# Patient Record
Sex: Male | Born: 1980 | Race: White | Hispanic: No | Marital: Single | Smoking: Former smoker
Health system: Southern US, Community
[De-identification: ages and names within clinical notes are randomized; demographics above are authoritative.]

## PROBLEM LIST (undated history)

## (undated) DIAGNOSIS — Z22322 Carrier or suspected carrier of Methicillin resistant Staphylococcus aureus: Secondary | ICD-10-CM

## (undated) HISTORY — PX: NO PAST SURGERIES: SHX2092

---

## 2012-05-01 ENCOUNTER — Encounter (HOSPITAL_COMMUNITY): Payer: Self-pay | Admitting: *Deleted

## 2012-05-01 ENCOUNTER — Emergency Department (HOSPITAL_COMMUNITY)
Admission: EM | Admit: 2012-05-01 | Discharge: 2012-05-02 | Disposition: A | Discharge status: Home / Self Care | Payer: Self-pay | Attending: Emergency Medicine | Admitting: Emergency Medicine

## 2012-05-01 DIAGNOSIS — F17200 Nicotine dependence, unspecified, uncomplicated: Secondary | ICD-10-CM | POA: Insufficient documentation

## 2012-05-01 DIAGNOSIS — L03116 Cellulitis of left lower limb: Secondary | ICD-10-CM

## 2012-05-01 DIAGNOSIS — L02419 Cutaneous abscess of limb, unspecified: Secondary | ICD-10-CM | POA: Insufficient documentation

## 2012-05-01 DIAGNOSIS — M7989 Other specified soft tissue disorders: Secondary | ICD-10-CM | POA: Insufficient documentation

## 2012-05-01 DIAGNOSIS — R21 Rash and other nonspecific skin eruption: Secondary | ICD-10-CM | POA: Insufficient documentation

## 2012-05-01 NOTE — ED Notes (Signed)
Patient with left leg infection.  The left knee is red and swollen and does down to his ankle.

## 2012-05-02 MED ORDER — HYDROCODONE-ACETAMINOPHEN 5-500 MG PO TABS: 1.0000 | ORAL_TABLET | Freq: Four times a day (QID) | ORAL | Status: DC | PRN

## 2012-05-02 MED ORDER — VANCOMYCIN HCL IN DEXTROSE 1-5 GM/200ML-% IV SOLN
1000.0000 mg | Freq: Once | INTRAVENOUS | Status: AC
  Administered 2012-05-02: 1000 mg via INTRAVENOUS
  Filled 2012-05-02: qty 200

## 2012-05-02 MED ORDER — SULFAMETHOXAZOLE-TRIMETHOPRIM 800-160 MG PO TABS: 1.0000 | ORAL_TABLET | Freq: Two times a day (BID) | ORAL | Status: DC

## 2012-05-02 MED ORDER — SULFAMETHOXAZOLE-TMP DS 800-160 MG PO TABS
1.0000 | ORAL_TABLET | Freq: Once | ORAL | Status: AC
  Administered 2012-05-02: 1 via ORAL
  Filled 2012-05-02: qty 1

## 2012-05-02 NOTE — ED Notes (Signed)
Prescriptions x2 given with discharge instructions.  

## 2012-05-02 NOTE — ED Provider Notes (Signed)
History     CSN: [id number]  Arrival date & time 05/01/12  2132   First MD Initiated Contact with Patient 05/02/12 0002      Chief Complaint  Patient presents with  . Leg Swelling    (Consider location/radiation/quality/duration/timing/severity/associated sxs/prior treatment) HPI Comments: Patient with history of mrsa, now with redness and swelling to the left kneecap area.  No fevers or vomiting.  Patient is a 31 y.o. male presenting with rash. The history is provided by the patient.  Rash  This is a new problem. The current episode started 2 days ago. The problem has been gradually worsening. The problem is associated with nothing. There has been no fever. Affected Location: left knee. The pain is moderate. The pain has been constant since onset. Associated symptoms include pain. He has tried nothing for the symptoms. The treatment provided no relief.    History reviewed. No pertinent past medical history.  History reviewed. No pertinent past surgical history.  History reviewed. No pertinent family history.  History  Substance Use Topics  . Smoking status: Current Everyday Smoker    Types: Cigars  . Smokeless tobacco: Not on file  . Alcohol Use: Yes      Review of Systems  Skin: Positive for rash.  All other systems reviewed and are negative.    Allergies  Review of patient's allergies indicates no known allergies.  Home Medications   Current Outpatient Rx  Name Route Sig Dispense Refill  . ACETAMINOPHEN 500 MG PO TABS Oral Take 1,000 mg by mouth every 6 (six) hours as needed. For pain    . IBUPROFEN 200 MG PO TABS Oral Take 800 mg by mouth every 6 (six) hours as needed. For pain    . NAPROXEN 250 MG PO TABS Oral Take 250 mg by mouth 2 (two) times daily as needed. For pain      BP 108/56  Pulse 81  Temp 98.7 F (37.1 C) (Oral)  Resp 16  SpO2 97%  Physical Exam  Nursing note and vitals reviewed. Constitutional: He is oriented to person, place, and  time. He appears well-developed and well-nourished. No distress.  HENT:  Head: Normocephalic and atraumatic.  Neck: Normal range of motion. Neck supple.  Cardiovascular: Normal rate and regular rhythm.   Pulmonary/Chest: Effort normal.  Musculoskeletal: Normal range of motion.       There is an erythematous, swollen, ttp area to the lateral aspect of the left knee.  It is warm to the touch.  No pain with rom.  No streaking noted.  Good distal pulses.  Neurological: He is alert and oriented to person, place, and time.  Skin: Skin is warm. He is not diaphoretic.    ED Course  Procedures (including critical care time)  Labs Reviewed - No data to display No results found.   No diagnosis found.    MDM  Patient was given vancomycin and bactrim.  Discussed cdu, however he wants to go home with po meds and will return if her worsens.          Geoffery Lyons, MD 05/02/12 [phone]

## 2012-05-02 NOTE — ED Notes (Signed)
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

## 2012-05-04 ENCOUNTER — Observation Stay (HOSPITAL_COMMUNITY)
Admission: EM | Admit: 2012-05-04 | Discharge: 2012-05-05 | Disposition: A | Discharge status: Home / Self Care | Payer: Self-pay | Attending: Emergency Medicine | Admitting: Emergency Medicine

## 2012-05-04 DIAGNOSIS — L03119 Cellulitis of unspecified part of limb: Secondary | ICD-10-CM | POA: Insufficient documentation

## 2012-05-04 DIAGNOSIS — L02419 Cutaneous abscess of limb, unspecified: Principal | ICD-10-CM | POA: Insufficient documentation

## 2012-05-04 DIAGNOSIS — L02416 Cutaneous abscess of left lower limb: Secondary | ICD-10-CM

## 2012-05-04 DIAGNOSIS — L0390 Cellulitis, unspecified: Secondary | ICD-10-CM

## 2012-05-04 LAB — CBC
MCH: 31.7 pg (ref 26.0–34.0)
MCV: 87.8 fL (ref 78.0–100.0)
Platelets: 249 10*3/uL (ref 150–400)
RDW: 12.2 % (ref 11.5–15.5)
WBC: 12.8 10*3/uL — ABNORMAL HIGH (ref 4.0–10.5)

## 2012-05-04 LAB — BASIC METABOLIC PANEL
Calcium: 9.5 mg/dL (ref 8.4–10.5)
Creatinine, Ser: 0.87 mg/dL (ref 0.50–1.35)
GFR calc Af Amer: >90 mL/min (ref >90)

## 2012-05-04 MED ORDER — CLINDAMYCIN PHOSPHATE 900 MG/50ML IV SOLN
900.0000 mg | Freq: Once | INTRAVENOUS | Status: DC
  Filled 2012-05-04: qty 50

## 2012-05-04 MED ORDER — MORPHINE SULFATE 4 MG/ML IJ SOLN
4.0000 mg | INTRAMUSCULAR | Status: DC | PRN
  Administered 2012-05-05 (×2): 4 mg via INTRAVENOUS
  Filled 2012-05-04 (×2): qty 1

## 2012-05-04 MED ORDER — CLINDAMYCIN PHOSPHATE 600 MG/50ML IV SOLN
600.0000 mg | Freq: Once | INTRAVENOUS | Status: AC
  Administered 2012-05-04: 600 mg via INTRAVENOUS

## 2012-05-04 MED ORDER — CLINDAMYCIN PHOSPHATE 600 MG/50ML IV SOLN
600.0000 mg | Freq: Three times a day (TID) | INTRAVENOUS | Status: DC
  Administered 2012-05-05 (×2): 600 mg via INTRAVENOUS
  Filled 2012-05-04 (×3): qty 50

## 2012-05-04 MED ORDER — ZOLPIDEM TARTRATE 5 MG PO TABS
10.0000 mg | ORAL_TABLET | Freq: Every evening | ORAL | Status: DC | PRN
  Administered 2012-05-05: 10 mg via ORAL
  Filled 2012-05-04: qty 2

## 2012-05-04 MED ORDER — ONDANSETRON HCL 4 MG/2ML IJ SOLN: 4.0000 mg | Freq: Four times a day (QID) | INTRAMUSCULAR | Status: DC | PRN

## 2012-05-04 MED ORDER — ACETAMINOPHEN 325 MG PO TABS: 650.0000 mg | ORAL_TABLET | ORAL | Status: DC | PRN

## 2012-05-04 NOTE — ED Notes (Signed)
States his left knee is infected. Patient thinks he Eaker have staph infection. States he was here Monday for same. Describes pain as throbbing. Denies pain at this time, but states its starting to come back. Left knee wrapped in gauze. Bright red blood noted

## 2012-05-04 NOTE — ED Notes (Signed)
Lt. Knee pain. Last Friday, noticed a bump on lateral side of lt. Knee. Came up Monday for tx. Given iv antibiotics. And oral bactrim. And symptoms not getting better. The redness is spreading up thigh. And down to lt. Leg.

## 2012-05-04 NOTE — ED Provider Notes (Signed)
History    This chart was scribed for Rolan Bucco, MD, MD by Smitty Pluck. The patient was seen in room TR05C and the patient's care was started at 7:38PM.   CSN: [id number]  Arrival date & time 05/04/12  1807   First MD Initiated Contact with Patient 05/04/12 1857      Chief Complaint  Patient presents with  . Knee Pain    (Consider location/radiation/quality/duration/timing/severity/associated sxs/prior treatment) Patient is a 31 y.o. male presenting with knee pain. The history is provided by the patient.  Knee Pain Pertinent negatives include no headaches and no shortness of breath.   Miguel Kline is a 31 y.o. male who presents to the Emergency Department complaining of constant moderate left knee pain onset 5 days ago. Pt reports that he was in ED 3 days ago and was given IV abx and oral bactrim. He denies fevers. Reports symptoms have remained after abx treatment. He reports that redness is radiating throughout leg. Pain is aggravated by touch, walking and showering. He reports having green drainage. Denies pain in knee joint. Reports that his posterior knee feels tight.   No past medical history on file.  No past surgical history on file.  No family history on file.  History  Substance Use Topics  . Smoking status: Current Everyday Smoker    Types: Cigars  . Smokeless tobacco: Not on file  . Alcohol Use: Yes      Review of Systems  Constitutional: Negative for fever and chills.  Respiratory: Negative for cough and shortness of breath.   Gastrointestinal: Negative for nausea and vomiting.  Musculoskeletal: Positive for joint swelling.  Skin: Positive for wound. Negative for rash.  Neurological: Negative for headaches.  All other systems reviewed and are negative.    Allergies  Review of patient's allergies indicates no known allergies.  Home Medications   Current Outpatient Rx  Name Route Sig Dispense Refill  . HYDROCODONE-ACETAMINOPHEN 5-500 MG PO  TABS Oral Take 1-2 tablets by mouth every 6 (six) hours as needed for pain. 15 tablet 0  . NAPROXEN SODIUM 220 MG PO TABS Oral Take 220 mg by mouth daily as needed. For pain    . SULFAMETHOXAZOLE-TRIMETHOPRIM 800-160 MG PO TABS Oral Take 1 tablet by mouth every 12 (twelve) hours. 20 tablet 0    BP 133/61  Pulse 94  Temp 98.7 F (37.1 C) (Oral)  Resp 18  SpO2 95%  Physical Exam  Nursing note and vitals reviewed. Constitutional: He is oriented to person, place, and time. He appears well-developed and well-nourished.  HENT:  Head: Normocephalic and atraumatic.  Mouth/Throat: Oropharynx is clear and moist.  Eyes: Pupils are equal, round, and reactive to light.  Neck: Normal range of motion. Neck supple.       No pain to neck or back  Cardiovascular: Normal rate, regular rhythm, normal heart sounds and intact distal pulses.   Pulmonary/Chest: Effort normal and breath sounds normal.  Abdominal: Soft. Bowel sounds are normal. There is no tenderness.  Musculoskeletal: He exhibits edema and tenderness.       No pain in knee joint  No effusion No pain with ROM of left knee   Neurological: He is alert and oriented to person, place, and time.  Skin: Skin is warm and dry.       3 cm fluctuant area on lateral aspect of left knee  With large area of erythema     ED Course  INCISION AND DRAINAGE Date/Time: 05/04/2012 8:08 PM  Performed by: Rolan Bucco Authorized by: Rolan Bucco Consent: Verbal consent obtained. Risks and benefits: risks, benefits and alternatives were discussed Consent given by: patient Patient identity confirmed: verbally with patient Time out: Immediately prior to procedure a "time out" was called to verify the correct patient, procedure, equipment, support staff and site/side marked as required. Type: abscess Body area: lower extremity Location details: left leg Anesthesia: local infiltration Local anesthetic: lidocaine 1% without epinephrine Anesthetic  total: 4 ml Patient sedated: no Scalpel size: 11 Incision type: single straight Complexity: simple Drainage: purulent Drainage amount: copious Wound treatment: wound left open Packing material: 1/4 in gauze Patient tolerance: Patient tolerated the procedure well with no immediate complications.   (including critical care time) DIAGNOSTIC STUDIES: Oxygen Saturation is 95% on room air, normal by my interpretation.    COORDINATION OF CARE:     Labs Reviewed  WOUND CULTURE  CBC  BASIC METABOLIC PANEL  CULTURE, BLOOD (ROUTINE X 2)  CULTURE, BLOOD (ROUTINE X 2)   No results found.   1. Cellulitis       MDM  Pt with abscess to lateral right knee with large amount of surrounding cellulitis.  Abscess with opened here with large amount of purulent drainage.  Wound culture sent.  Will move to CDU for IV abx.  Exam is not suggestive of septic arthritis    I personally performed the services described in this documentation, which was scribed in my presence.  The recorded information has been reviewed and considered.      Rolan Bucco, MD 05/04/12 2010

## 2012-05-05 MED ORDER — OXYCODONE-ACETAMINOPHEN 5-325 MG PO TABS: 1.0000 | ORAL_TABLET | ORAL | Status: AC | PRN

## 2012-05-05 MED ORDER — CLINDAMYCIN HCL 150 MG PO CAPS: 300.0000 mg | ORAL_CAPSULE | Freq: Three times a day (TID) | ORAL | Status: DC

## 2012-05-05 MED ORDER — OXYCODONE-ACETAMINOPHEN 5-325 MG PO TABS
1.0000 | ORAL_TABLET | ORAL | Status: DC | PRN
  Administered 2012-05-05: 1 via ORAL
  Filled 2012-05-05: qty 1

## 2012-05-05 NOTE — ED Provider Notes (Signed)
I was available for the CDU portion of this patient's care.   Gerhard Munch, MD 05/05/12 1728

## 2012-05-05 NOTE — ED Notes (Signed)
Ortho coming to bring pt crutches

## 2012-05-05 NOTE — ED Notes (Signed)
Family at bedside. 

## 2012-05-05 NOTE — ED Provider Notes (Signed)
11:42 AM I have seen and examined pt in CDU. Pt on cellulitis protocol. Abscess to the left knee drained yesterday, surrounding cellulitis. Pt received clindamycin x2, plan to reassess after 3rd dose at 1300. Pt stats his cellulitis is improving.   Filed Vitals:   05/05/12 1115  BP: 123/81  Pulse: 81  Temp:   Resp:    Pt in NAD, lungs clear to auscultation bilat. Regular hr and rhythm. Left knee bandaged, some drainage noted through the dressing. Erythema extending up left lateral thigh, and down left calf, about mid way. Marked with surgical marker, does not extend past borders. Will continue monitoring  2:02 PM Pt received 3 doses of IV antibiotics now, i reassessed his knee wound, I was able to express a large amount of drainge out of the already packed wound. Cellulitis is improved, and has almost subsided from the calf, still some mild redness over the thigh. Pt afebrile, non toxic, wanting to go home. Will d/c with clindamycin, pain medications, return in 2 days.   Filed Vitals:   05/05/12 1200  BP: 125/68  Pulse: 84  Temp:   Resp:        Lottie Mussel, PA 05/05/12 1404

## 2012-05-05 NOTE — Progress Notes (Signed)
Orthopedic Tech Progress Note Patient Details:  Miguel Kline 1981-04-26 [id number]  Ortho Devices Type of Ortho Device: Crutches Ortho Device/Splint Interventions: Casandra Doffing 05/05/2012, 2:18 PM

## 2012-05-07 ENCOUNTER — Emergency Department (HOSPITAL_COMMUNITY)
Admission: EM | Admit: 2012-05-07 | Discharge: 2012-05-08 | Disposition: A | Discharge status: Home / Self Care | Payer: Self-pay | Attending: Emergency Medicine | Admitting: Emergency Medicine

## 2012-05-07 DIAGNOSIS — Z4801 Encounter for change or removal of surgical wound dressing: Secondary | ICD-10-CM | POA: Insufficient documentation

## 2012-05-08 ENCOUNTER — Encounter (HOSPITAL_COMMUNITY): Payer: Self-pay | Admitting: Emergency Medicine

## 2012-05-08 ENCOUNTER — Emergency Department (HOSPITAL_COMMUNITY)
Admission: EM | Admit: 2012-05-08 | Discharge: 2012-05-08 | Disposition: A | Discharge status: Home / Self Care | Payer: Self-pay | Attending: Emergency Medicine | Admitting: Emergency Medicine

## 2012-05-08 DIAGNOSIS — F17200 Nicotine dependence, unspecified, uncomplicated: Secondary | ICD-10-CM | POA: Insufficient documentation

## 2012-05-08 DIAGNOSIS — L0291 Cutaneous abscess, unspecified: Secondary | ICD-10-CM

## 2012-05-08 DIAGNOSIS — L02419 Cutaneous abscess of limb, unspecified: Secondary | ICD-10-CM | POA: Insufficient documentation

## 2012-05-08 LAB — WOUND CULTURE: Gram Stain: NONE SEEN

## 2012-05-08 MED ORDER — HYDROCODONE-ACETAMINOPHEN 5-325 MG PO TABS: 1.0000 | ORAL_TABLET | Freq: Four times a day (QID) | ORAL | Status: AC | PRN

## 2012-05-08 MED ORDER — LIDOCAINE HCL 2 % IJ SOLN
5.0000 mL | Freq: Once | INTRAMUSCULAR | Status: AC
  Administered 2012-05-08: 100 mg via INTRADERMAL

## 2012-05-08 NOTE — ED Notes (Signed)
Pt. Stated, I'm here to get "antibiotic strips out of my knee"

## 2012-05-08 NOTE — ED Notes (Signed)
Returned to have packing removed from wound to left lateral knee. Noted scant amt serous drainage at site. No redness or edema to surrounding areas

## 2012-05-08 NOTE — ED Provider Notes (Signed)
Medical screening examination/treatment/procedure(s) were performed by non-physician practitioner and as supervising physician I was immediately available for consultation/collaboration.   Gwyneth Sprout, MD 05/08/12 1513

## 2012-05-08 NOTE — ED Provider Notes (Signed)
History     CSN: [id number]  Arrival date & time 05/08/12  7829   First MD Initiated Contact with Patient 05/08/12 0915      Chief Complaint  Patient presents with  . Wound Check    (Consider location/radiation/quality/duration/timing/severity/associated sxs/prior treatment) HPI Comments: Patient with hx abscess I&D in ED 2 days ago.  Pt is on bactrim and keflex, reports great improvement in redness, swelling, and pain.  Denies fevers.  States he attempted to take the packing out on his own but it is too painful.  Requests localized numbing medication prior to attempting this here.    Patient is a 31 y.o. male presenting with wound check. The history is provided by the patient.  Wound Check     History reviewed. No pertinent past medical history.  History reviewed. No pertinent past surgical history.  No family history on file.  History  Substance Use Topics  . Smoking status: Current Everyday Smoker    Types: Cigars  . Smokeless tobacco: Not on file  . Alcohol Use: Yes      Review of Systems  Constitutional: Negative for fever and chills.  Skin: Positive for wound. Negative for color change, pallor and rash.    Allergies  Review of patient's allergies indicates no known allergies.  Home Medications   Current Outpatient Rx  Name Route Sig Dispense Refill  . CLINDAMYCIN HCL 150 MG PO CAPS Oral Take 300 mg by mouth 3 (three) times daily. Starting 05/05/12 for 10 days    . NAPROXEN SODIUM 220 MG PO TABS Oral Take 220 mg by mouth daily as needed. For pain    . OXYCODONE-ACETAMINOPHEN 5-325 MG PO TABS Oral Take 1 tablet by mouth every 4 (four) hours as needed for pain. 20 tablet 0  . SULFAMETHOXAZOLE-TRIMETHOPRIM 800-160 MG PO TABS Oral Take 1 tablet by mouth every 12 (twelve) hours. Starting 05/02/12 for 10 days      BP 114/4  Pulse 99  Temp 98.4 F (36.9 C) (Oral)  Resp 16  SpO2 100%  Physical Exam  Nursing note and vitals reviewed. Constitutional: He  appears well-developed and well-nourished. No distress.  HENT:  Head: Normocephalic and atraumatic.  Neck: Neck supple.  Pulmonary/Chest: Effort normal.  Neurological: He is alert.  Skin: He is not diaphoretic.       Skin just superior to left lateral knee with packing in place.  No active discharge.  Pt with surgical marker marking previous cellulitis involving much of left thigh and left ankle.  Skin within this area is now clear.  No erythema, edema, warmth.  There is one small (approx 2 cm diameter) erythematous spot superior to the abscess/packing without induration or fluctuance.      ED Course  Procedures (including critical care time)  Labs Reviewed - No data to display No results found.  Abscess rechecked.  Localized numbing agent used prior to packing removal per patient request.  This was performed by Laverle Patter, PA-S, under my supervision.  Area around abscess injected locally with 2% lidocaine without epinephrine, packing removed.  Wound is clean with serous drainage.  Dry dressing applied by nurse.    1. Abscess       MDM  Nontoxic afebrile patient presents for recheck after I&D 4 days ago.  Has been taking bactrim and keflex with great improvement.  Packing removed.  No need to repack wound.  Healing well.  Pt requests more pain medication as it continues to be sore.  Have  d/c home with #8 norco.  Return precautions given.  Patient verbalizes understanding and agrees with plan.          Rise Patience, [place] 05/08/12 1118

## 2012-05-08 NOTE — ED Notes (Signed)
See downtime charting. 

## 2012-05-09 ENCOUNTER — Telehealth (HOSPITAL_COMMUNITY): Payer: Self-pay | Admitting: Emergency Medicine

## 2012-05-09 NOTE — ED Notes (Signed)
+  wound Chart sent to EDP office for review. 

## 2012-05-09 NOTE — ED Notes (Signed)
Patient informed of positive results after id'd x 2 and educated to MRSA precautions. 

## 2012-05-11 LAB — CULTURE, BLOOD (ROUTINE X 2): Culture: NO GROWTH

## 2012-10-10 ENCOUNTER — Emergency Department (HOSPITAL_COMMUNITY)
Admission: EM | Admit: 2012-10-10 | Discharge: 2012-10-10 | Disposition: A | Discharge status: Home / Self Care | Payer: Self-pay | Attending: Emergency Medicine | Admitting: Emergency Medicine

## 2012-10-10 ENCOUNTER — Encounter (HOSPITAL_COMMUNITY): Payer: Self-pay | Admitting: *Deleted

## 2012-10-10 DIAGNOSIS — L0390 Cellulitis, unspecified: Secondary | ICD-10-CM

## 2012-10-10 DIAGNOSIS — F17200 Nicotine dependence, unspecified, uncomplicated: Secondary | ICD-10-CM | POA: Insufficient documentation

## 2012-10-10 DIAGNOSIS — R21 Rash and other nonspecific skin eruption: Secondary | ICD-10-CM | POA: Insufficient documentation

## 2012-10-10 DIAGNOSIS — M25579 Pain in unspecified ankle and joints of unspecified foot: Secondary | ICD-10-CM | POA: Insufficient documentation

## 2012-10-10 DIAGNOSIS — L02419 Cutaneous abscess of limb, unspecified: Secondary | ICD-10-CM | POA: Insufficient documentation

## 2012-10-10 MED ORDER — DOXYCYCLINE HYCLATE 100 MG PO TABS
100.0000 mg | ORAL_TABLET | Freq: Once | ORAL | Status: AC
  Administered 2012-10-10: 100 mg via ORAL
  Filled 2012-10-10: qty 1

## 2012-10-10 MED ORDER — SULFAMETHOXAZOLE-TRIMETHOPRIM 800-160 MG PO TABS: 1.0000 | ORAL_TABLET | Freq: Two times a day (BID) | ORAL | Status: AC

## 2012-10-10 MED ORDER — HYDROCODONE-ACETAMINOPHEN 5-325 MG PO TABS: 2.0000 | ORAL_TABLET | ORAL | Status: DC | PRN

## 2012-10-10 MED ORDER — HYDROCODONE-ACETAMINOPHEN 5-325 MG PO TABS
1.0000 | ORAL_TABLET | Freq: Once | ORAL | Status: AC
  Administered 2012-10-10: 1 via ORAL
  Filled 2012-10-10: qty 1

## 2012-10-10 MED ORDER — DOXYCYCLINE HYCLATE 100 MG PO CAPS: 100.0000 mg | ORAL_CAPSULE | Freq: Two times a day (BID) | ORAL | Status: DC

## 2012-10-10 MED ORDER — SULFAMETHOXAZOLE-TMP DS 800-160 MG PO TABS
1.0000 | ORAL_TABLET | Freq: Once | ORAL | Status: AC
  Administered 2012-10-10: 1 via ORAL
  Filled 2012-10-10: qty 1

## 2012-10-10 NOTE — ED Notes (Signed)
The pt has a boil on his lt thigh.  He has been squeezing the area.  It started 3 -4 days ago.  He is also c/o lt foot pain

## 2012-10-10 NOTE — ED Provider Notes (Signed)
History     CSN: [id number]  Arrival date & time 10/10/12  0102   First MD Initiated Contact with Patient 10/10/12 0117      Chief Complaint  Patient presents with  . Recurrent Skin Infections    (Consider location/radiation/quality/duration/timing/severity/associated sxs/prior treatment) Patient is a 31 y.o. male presenting with rash. The history is provided by the patient.  Rash  The current episode started more than 2 days ago. The problem has been gradually worsening. The problem is associated with nothing. There has been no fever. The rash is present on the left upper leg. The pain is at a severity of 4/10. The pain is mild. The pain has been constant since onset. Associated symptoms include itching. He has tried nothing for the symptoms. The treatment provided no relief.    History reviewed. No pertinent past medical history.  History reviewed. No pertinent past surgical history.  No family history on file.  History  Substance Use Topics  . Smoking status: Current Every Day Smoker    Types: Cigars  . Smokeless tobacco: Not on file  . Alcohol Use: Yes      Review of Systems  Skin: Positive for itching and rash.  All other systems reviewed and are negative.    Allergies  Review of patient's allergies indicates no known allergies.  Home Medications   Current Outpatient Rx  Name  Route  Sig  Dispense  Refill  . CLINDAMYCIN HCL 150 MG PO CAPS   Oral   Take 300 mg by mouth 3 (three) times daily. Starting 05/05/12 for 10 days         . NAPROXEN SODIUM 220 MG PO TABS   Oral   Take 220 mg by mouth daily as needed. For pain         . SULFAMETHOXAZOLE-TRIMETHOPRIM 800-160 MG PO TABS   Oral   Take 1 tablet by mouth every 12 (twelve) hours. Starting 05/02/12 for 10 days           BP 129/73  Pulse 86  Resp 18  SpO2 100%  Physical Exam  Constitutional: He is oriented to person, place, and time. He appears well-developed and well-nourished.  HENT:   Head: Normocephalic and atraumatic.  Eyes: Conjunctivae normal are normal. Pupils are equal, round, and reactive to light.  Neck: Normal range of motion. Neck supple.  Cardiovascular: Normal rate, regular rhythm, normal heart sounds and intact distal pulses.   Pulmonary/Chest: Effort normal and breath sounds normal.  Abdominal: Soft. Bowel sounds are normal.  Neurological: He is alert and oriented to person, place, and time.  Skin: Skin is warm and dry.       + mild cellulitis size of silver dollar to dorsum of left mid thigh.  nonfluctuant  Psychiatric: He has a normal mood and affect. His behavior is normal. Judgment and thought content normal.    ED Course  Procedures (including critical care time)  Labs Reviewed - No data to display No results found.   No diagnosis found.    MDM  Minimal cellulitis.  Will trat, d c to oupt fu,  Ret new/worsening sxs        Yiselle Babich Lytle Michaels, MD 10/10/12 0131

## 2013-04-11 ENCOUNTER — Emergency Department (HOSPITAL_COMMUNITY)
Admission: EM | Admit: 2013-04-11 | Discharge: 2013-04-12 | Disposition: A | Discharge status: Home / Self Care | Payer: Self-pay | Attending: Emergency Medicine | Admitting: Emergency Medicine

## 2013-04-11 ENCOUNTER — Encounter (HOSPITAL_COMMUNITY): Payer: Self-pay | Admitting: Emergency Medicine

## 2013-04-11 DIAGNOSIS — L02415 Cutaneous abscess of right lower limb: Secondary | ICD-10-CM

## 2013-04-11 DIAGNOSIS — F17200 Nicotine dependence, unspecified, uncomplicated: Secondary | ICD-10-CM | POA: Insufficient documentation

## 2013-04-11 DIAGNOSIS — IMO0001 Reserved for inherently not codable concepts without codable children: Secondary | ICD-10-CM | POA: Insufficient documentation

## 2013-04-11 DIAGNOSIS — L02419 Cutaneous abscess of limb, unspecified: Secondary | ICD-10-CM | POA: Insufficient documentation

## 2013-04-11 DIAGNOSIS — L03119 Cellulitis of unspecified part of limb: Secondary | ICD-10-CM | POA: Insufficient documentation

## 2013-04-11 NOTE — ED Notes (Signed)
PT. REPORTS ABSCESS AT RIGHT KNEE WITH DRAINAGE ONSET 2 DAYS AGO.

## 2013-04-12 MED ORDER — SULFAMETHOXAZOLE-TRIMETHOPRIM 800-160 MG PO TABS: 1.0000 | ORAL_TABLET | Freq: Two times a day (BID) | ORAL | Status: AC

## 2013-04-12 MED ORDER — CEPHALEXIN 500 MG PO CAPS: 500.0000 mg | ORAL_CAPSULE | Freq: Four times a day (QID) | ORAL | Status: DC

## 2013-04-12 NOTE — ED Provider Notes (Signed)
History    CSN: [id number] Arrival date & time 04/11/13  2338  First MD Initiated Contact with Patient 04/11/13 2358     Chief Complaint  Patient presents with  . Abscess   (Consider location/radiation/quality/duration/timing/severity/associated sxs/prior Treatment) HPI Comments: Abscess of R medial knee x 3 days. Patient has been applying warm compresses with mild relief and expelled blood, clear fluid, and small amount of purulent drainage PTA. Denies numbness/tingling, extremity weakness, linear red streaking, fevers, and inability to ambulate.  Patient is a 32 y.o. male presenting with abscess. The history is provided by the patient. No language interpreter was used.  Abscess Abscess location: medial R knee. Size:  1 cm area of induration with area of surrounding erythema of approximately 3.5 cm Abscess quality: induration, painful, redness and warmth   Abscess quality: not draining and no fluctuance   Red streaking: no   Duration:  3 days Progression:  Worsening Pain details:    Quality:  Throbbing   Severity:  Mild   Duration:  2 days   Timing:  Intermittent   Subjective pain progression: mildly worsening. Chronicity:  Recurrent Relieved by:  Warm compresses and draining/squeezing Worsened by:  Nothing tried Ineffective treatments:  None tried Associated symptoms: no fever   Risk factors: prior abscess    History reviewed. No pertinent past medical history. History reviewed. No pertinent past surgical history. No family history on file. History  Substance Use Topics  . Smoking status: Current Every Day Smoker    Types: Cigars  . Smokeless tobacco: Not on file  . Alcohol Use: Yes    Review of Systems  Constitutional: Negative for fever.  Musculoskeletal: Positive for myalgias. Negative for joint swelling.  Skin: Positive for color change. Negative for pallor and wound.  Neurological: Negative for weakness and numbness.  All other systems reviewed and are  negative.    Allergies  Review of patient's allergies indicates no known allergies.  Home Medications   Current Outpatient Rx  Name  Route  Sig  Dispense  Refill  . cephALEXin (KEFLEX) 500 MG capsule   Oral   Take 1 capsule (500 mg total) by mouth 4 (four) times daily.   28 capsule   0   . sulfamethoxazole-trimethoprim (BACTRIM DS,SEPTRA DS) 800-160 MG per tablet   Oral   Take 1 tablet by mouth 2 (two) times daily.   14 tablet   0    BP 141/58  Pulse 77  Temp(Src) 98.3 F (36.8 C) (Oral)  Resp 18  SpO2 100% Physical Exam  Nursing note and vitals reviewed. Constitutional: He is oriented to person, place, and time. He appears well-developed and well-nourished. No distress.  HENT:  Head: Normocephalic and atraumatic.  Eyes: Conjunctivae and EOM are normal. No scleral icterus.  Neck: Normal range of motion. Neck supple.  Cardiovascular: Normal rate, regular rhythm and intact distal pulses.   DP and PT pulses 2+ bilaterally. Capillary refill normal.  Pulmonary/Chest: Effort normal. No respiratory distress.  Musculoskeletal: Normal range of motion. He exhibits no edema.  Lymphadenopathy:    He has no cervical adenopathy.  Neurological: He is alert and oriented to person, place, and time. He has normal strength and normal reflexes. No sensory deficit.  Skin: Skin is warm and dry. No rash noted. He is not diaphoretic. There is erythema. No pallor.     1cm area of induration c/w abscess with area of surrounding blanching erythema measuring 3.5 cm. No active drainage or area of fluctuance. No  linear streaking.  Psychiatric: He has a normal mood and affect. His behavior is normal.    ED Course  Procedures (including critical care time) Labs Reviewed - No data to display No results found.  1. Abscess of knee, right     MDM  Uncomplicated abscess - Patient afebrile and neurovascularly intact. Abscess indurated without central area of fluctuance and no red linear  streaking noted; no joint swelling, crepitus or effusion of R knee and patient ambulatory. Given size and physical exam findings do not believe incision and drainage is necessary at this time. Patient appropriate for discharge with antibiotics. Have also recommended warm compresses to promote drainage and ibuprofen as needed for discomfort. Indications for ED return discussed with patient who verbalizes comfort and understanding with plan with no unaddressed concerns.  Antony Madura, PA-C 04/12/13 0041

## 2013-04-12 NOTE — ED Notes (Signed)
PA at bedside.

## 2013-04-12 NOTE — ED Notes (Addendum)
Pt presents with 1cm red area to interior aspect of right knee. States that "I pushed on it earlier today and white stuff came out. I just need some antibiotics." Hx of abscess.

## 2013-04-12 NOTE — ED Provider Notes (Signed)
Medical screening examination/treatment/procedure(s) were performed by non-physician practitioner and as supervising physician I was immediately available for consultation/collaboration.   Javone Ybanez, MD 04/12/13 0929 

## 2013-08-30 ENCOUNTER — Emergency Department (HOSPITAL_COMMUNITY)
Admission: EM | Admit: 2013-08-30 | Discharge: 2013-08-30 | Disposition: A | Discharge status: Home / Self Care | Payer: Self-pay | Attending: Emergency Medicine | Admitting: Emergency Medicine

## 2013-08-30 ENCOUNTER — Encounter (HOSPITAL_COMMUNITY): Payer: Self-pay | Admitting: Emergency Medicine

## 2013-08-30 DIAGNOSIS — L0231 Cutaneous abscess of buttock: Secondary | ICD-10-CM | POA: Insufficient documentation

## 2013-08-30 DIAGNOSIS — Z8614 Personal history of Methicillin resistant Staphylococcus aureus infection: Secondary | ICD-10-CM | POA: Insufficient documentation

## 2013-08-30 DIAGNOSIS — F17200 Nicotine dependence, unspecified, uncomplicated: Secondary | ICD-10-CM | POA: Insufficient documentation

## 2013-08-30 DIAGNOSIS — L03317 Cellulitis of buttock: Secondary | ICD-10-CM

## 2013-08-30 HISTORY — DX: Carrier or suspected carrier of methicillin resistant Staphylococcus aureus: Z22.322

## 2013-08-30 MED ORDER — LIDOCAINE-EPINEPHRINE 2 %-1:100000 IJ SOLN
10.0000 mL | Freq: Once | INTRAMUSCULAR | Status: AC
  Administered 2013-08-30: 10 mL via INTRADERMAL
  Filled 2013-08-30: qty 10

## 2013-08-30 MED ORDER — OXYCODONE-ACETAMINOPHEN 5-325 MG PO TABS: 2.0000 | ORAL_TABLET | ORAL | Status: DC | PRN

## 2013-08-30 MED ORDER — HYDROCODONE-ACETAMINOPHEN 5-325 MG PO TABS
1.0000 | ORAL_TABLET | Freq: Once | ORAL | Status: AC
  Administered 2013-08-30: 1 via ORAL
  Filled 2013-08-30: qty 1

## 2013-08-30 MED ORDER — SULFAMETHOXAZOLE-TRIMETHOPRIM 800-160 MG PO TABS: 1.0000 | ORAL_TABLET | Freq: Two times a day (BID) | ORAL | Status: DC

## 2013-08-30 MED ORDER — CEPHALEXIN 500 MG PO CAPS: 500.0000 mg | ORAL_CAPSULE | Freq: Four times a day (QID) | ORAL | Status: DC

## 2013-08-30 NOTE — ED Notes (Signed)
Pt with hx of rash and abscesses to bottom after being [facility].  Rash to buttocks with 1 large abscess.  States was tx with oral antibiotics last time.

## 2013-08-30 NOTE — ED Provider Notes (Signed)
Medical screening examination/treatment/procedure(s) were performed by non-physician practitioner and as supervising physician I was immediately available for consultation/collaboration.   Acire Tang B. Amit Leece, MD 08/30/13 2048 

## 2013-08-30 NOTE — ED Provider Notes (Signed)
CSN: [id number]     Arrival date & time 08/30/13  1824 History  This chart was scribed for non-physician practitioner Fayrene Helper, PA-C working with Bonnita Levan. Bernette Mayers, MD by Danella Maiers, ED Scribe. This patient was seen in room TR10C/TR10C and the patient's care was started at 6:51 PM.   Chief Complaint  Patient presents with  . rash on bottom     hx of mrsa   Patient is a 32 y.o. male presenting with abscess. The history is provided by the patient. No language interpreter was used.  Abscess Location:  Ano-genital Ano-genital abscess location:  Gluteal cleft Abscess quality: draining and painful   Abscess quality: no itching   Duration:  3 days Progression:  Worsening Chronicity:  Recurrent Relieved by:  Nothing Ineffective treatments:  NSAIDs Associated symptoms: no fever, no nausea and no vomiting   Risk factors: hx of MRSA    HPI Comments: Zebulon Villard is a 32 y.o. male with a h/o MRSA who presents to the Emergency Department complaining of abscess and rash to the buttocks that started 2-3 days ago. He reports yellow/green pus drainage yesterday. 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 He states he is usually treated with antibiotics and the infection resolves. He has been taking Excedrin for the pain. He denies itching. He denies fever, chills, nausea, vomiting, problems with urination or BM.  Past Medical History  Diagnosis Date  . MRSA (methicillin resistant staph aureus) culture positive    History reviewed. No pertinent past surgical history. No family history on file. History  Substance Use Topics  . Smoking status: Current Every Day Smoker    Types: Cigars  . Smokeless tobacco: Not on file  . Alcohol Use: Yes    Review of Systems  Constitutional: Negative for fever and chills.  Gastrointestinal: Negative for nausea and vomiting.  Skin: Positive for rash and wound.    Allergies  Review of  patient's allergies indicates no known allergies.  Home Medications  No current outpatient prescriptions on file. BP 119/70  Pulse 100  Temp(Src) 97.6 F (36.4 C) (Oral)  Resp 16  Ht 5\' 11"  (1.803 m)  Wt 140 lb (63.504 kg)  BMI 19.53 kg/m2  SpO2 96% Physical Exam  Nursing note and vitals reviewed. Constitutional: He is oriented to person, place, and time. He appears well-developed and well-nourished. No distress.  HENT:  Head: Normocephalic and atraumatic.  Eyes: EOM are normal.  Neck: Neck supple. No tracheal deviation present.  Cardiovascular: Normal rate.   Pulmonary/Chest: Effort normal. No respiratory distress.  Musculoskeletal: Normal range of motion.  Neurological: He is alert and oriented to person, place, and time.  Skin: Skin is warm and dry.  erythematous scattered papules right and left buttock area with area of induration and fluctuance the size of a golf ball  Psychiatric: He has a normal mood and affect. His behavior is normal.    ED Course  Procedures (including critical care time) Medications  lidocaine-EPINEPHrine (XYLOCAINE W/EPI) 2 %-1:100000 (with pres) injection 10 mL (not administered)  HYDROcodone-acetaminophen (NORCO/VICODIN) 5-325 MG per tablet 1 tablet (not administered)    DIAGNOSTIC STUDIES: Oxygen Saturation is 96% on RA, normal by my interpretation.    COORDINATION OF CARE: 7:22 PM- Discussed treatment plan with pt which includes I&D and antibiotics. Pt agrees to plan.  INCISION AND DRAINAGE Performed by: Chelsea Aus under direct supervision of Fayrene Helper, PA-C Consent: Verbal consent  obtained. Risks and benefits: risks, benefits and alternatives were discussed Type: abscess  Body area: gluteal cleft  Anesthesia: local infiltration  Incision was made with a scalpel.  Local anesthetic: lidocaine 2% with epinephrine  Anesthetic total: 3 ml  Complexity: complex Blunt dissection to break up loculations  Drainage:  purulent  Drainage amount: 5 ml  Packing material: 1/4 in iodoform gauze  Patient tolerance: Patient tolerated the procedure well with no immediate complications.   8:39 PM Pt with recurrent buttock abscess.  No rectal involvement.  Does have a rash to gluteal region as well, non itchy.  I&D was performed with moderate pustular discharge.  Will d/c with keflex/bactrim along with pain medication.  Recommend Sitz bath and return precaution.  Pt voice understanding and agrees with plan.     Labs Review Labs Reviewed - No data to display Imaging Review No results found.  EKG Interpretation   None       MDM   1. Abscess of right buttock   2. Cellulitis of buttock    BP 119/70  Pulse 100  Temp(Src) 97.6 F (36.4 C) (Oral)  Resp 16  Ht 5\' 11"  (1.803 m)  Wt 140 lb (63.504 kg)  BMI 19.53 kg/m2  SpO2 96%    I personally performed the services described in this documentation, which was scribed in my presence. The recorded information has been reviewed and is accurate.     Fayrene Helper, PA-C 08/30/13 2041

## 2013-08-30 NOTE — ED Notes (Signed)
Pt st's he has a abscess on buttock x's 2-3 days.  St's area has drained but thinks he needs antibiotic.

## 2014-02-27 ENCOUNTER — Encounter (HOSPITAL_COMMUNITY): Payer: Self-pay | Admitting: Emergency Medicine

## 2014-02-27 ENCOUNTER — Emergency Department (HOSPITAL_COMMUNITY)
Admission: EM | Admit: 2014-02-27 | Discharge: 2014-02-27 | Disposition: A | Discharge status: Home / Self Care | Payer: Self-pay | Attending: Emergency Medicine | Admitting: Emergency Medicine

## 2014-02-27 DIAGNOSIS — F17200 Nicotine dependence, unspecified, uncomplicated: Secondary | ICD-10-CM | POA: Insufficient documentation

## 2014-02-27 DIAGNOSIS — L03317 Cellulitis of buttock: Principal | ICD-10-CM

## 2014-02-27 DIAGNOSIS — Z8614 Personal history of Methicillin resistant Staphylococcus aureus infection: Secondary | ICD-10-CM | POA: Insufficient documentation

## 2014-02-27 DIAGNOSIS — R197 Diarrhea, unspecified: Secondary | ICD-10-CM | POA: Insufficient documentation

## 2014-02-27 DIAGNOSIS — L0231 Cutaneous abscess of buttock: Secondary | ICD-10-CM | POA: Insufficient documentation

## 2014-02-27 MED ORDER — OXYCODONE-ACETAMINOPHEN 5-325 MG PO TABS
2.0000 | ORAL_TABLET | Freq: Once | ORAL | Status: AC
  Administered 2014-02-27: 2 via ORAL
  Filled 2014-02-27: qty 2

## 2014-02-27 MED ORDER — SULFAMETHOXAZOLE-TRIMETHOPRIM 800-160 MG PO TABS: 1.0000 | ORAL_TABLET | Freq: Two times a day (BID) | ORAL | Status: DC

## 2014-02-27 MED ORDER — CEPHALEXIN 500 MG PO CAPS: 500.0000 mg | ORAL_CAPSULE | Freq: Two times a day (BID) | ORAL | Status: DC

## 2014-02-27 MED ORDER — CEPHALEXIN 250 MG PO CAPS
500.0000 mg | ORAL_CAPSULE | Freq: Once | ORAL | Status: AC
  Administered 2014-02-27: 500 mg via ORAL
  Filled 2014-02-27: qty 2

## 2014-02-27 MED ORDER — SULFAMETHOXAZOLE-TMP DS 800-160 MG PO TABS
1.0000 | ORAL_TABLET | Freq: Once | ORAL | Status: AC
  Administered 2014-02-27: 1 via ORAL
  Filled 2014-02-27: qty 1

## 2014-02-27 MED ORDER — OXYCODONE-ACETAMINOPHEN 5-325 MG PO TABS: 1.0000 | ORAL_TABLET | ORAL | Status: DC | PRN

## 2014-02-27 NOTE — ED Provider Notes (Signed)
CSN: [id number]     Arrival date & time 02/27/14  0857 History   First MD Initiated Contact with Patient 02/27/14 [phone]     Chief Complaint  Patient presents with  . Abscess  . Diarrhea    HPI  Miguel Kline is a 33 y.o. male with a PMH of MRSA who presents to the ED for evaluation of an abscess and diarrhea. History was provided by the patient. Patient states he has had a developing abscess to the right buttocks for the past 2-3 days. Denies any previous wounds or trauma to the area. Patient has a hx of MRSA and abscesses in the past, however, not in the same location. Complains of a constant throbbing pain to the area which is worse with sitting. Has not taken anything for pain. Scrubbed the area in the shower last night but is unsure of any drainage. Had 5-6 episodes of loose brown stools with no hematochezia. No abdominal pain, nausea, or emesis. Has otherwise been feeling well with no fevers, chills, change in appetite/activity.    Past Medical History  Diagnosis Date  . MRSA (methicillin resistant staph aureus) culture positive    History reviewed. No pertinent past surgical history. History reviewed. No pertinent family history. History  Substance Use Topics  . Smoking status: Current Every Day Smoker    Types: Cigars  . Smokeless tobacco: Not on file  . Alcohol Use: Yes    Review of Systems  Constitutional: Negative for fever, chills, activity change, appetite change and fatigue.  Gastrointestinal: Positive for diarrhea. Negative for nausea, vomiting, abdominal pain, constipation, blood in stool, anal bleeding and rectal pain.  Musculoskeletal: Negative for back pain and myalgias.  Skin: Positive for color change and wound.  Neurological: Negative for dizziness, weakness, light-headedness and headaches.    Allergies  Review of patient's allergies indicates no known allergies.  Home Medications   Prior to Admission medications   Medication Sig Start Date End Date  Taking? Authorizing Provider  cephALEXin (KEFLEX) 500 MG capsule Take 1 capsule (500 mg total) by mouth 4 (four) times daily. 08/30/13   Fayrene HelperBowie Tran, PA-C  oxyCODONE-acetaminophen (PERCOCET/ROXICET) 5-325 MG per tablet Take 2 tablets by mouth every 4 (four) hours as needed for severe pain. 08/30/13   Fayrene HelperBowie Tran, PA-C  sulfamethoxazole-trimethoprim (BACTRIM DS,SEPTRA DS) 800-160 MG per tablet Take 1 tablet by mouth 2 (two) times daily. 08/30/13   Fayrene HelperBowie Tran, PA-C   BP 124/68  Pulse 99  Temp(Src) 98.1 F (36.7 C) (Oral)  Resp 18  SpO2 100%  Filed Vitals:   02/27/14 0945 02/27/14 1000 02/27/14 1015 02/27/14 1058  BP: 108/56 128/66 113/64 100/83  Pulse: 74 100 75   Temp:      TempSrc:      Resp:      SpO2: 99% 100% 97%      Physical Exam  Nursing note and vitals reviewed. Constitutional: He is oriented to person, place, and time. He appears well-developed and well-nourished. No distress.  Non-toxic  HENT:  Head: Normocephalic and atraumatic.  Right Ear: External ear normal.  Left Ear: External ear normal.  Nose: Nose normal.  Mouth/Throat: Oropharynx is clear and moist. No oropharyngeal exudate.  Eyes: Right eye exhibits no discharge. Left eye exhibits no discharge.  Neck: Neck supple.  Cardiovascular: Normal rate, regular rhythm, normal heart sounds and intact distal pulses.  Exam reveals no gallop and no friction rub.   No murmur heard. Pulmonary/Chest: Effort normal and breath sounds normal. No respiratory  distress. He has no wheezes. He has no rales. He exhibits no tenderness.  Abdominal: Soft. Bowel sounds are normal. He exhibits no distension and no mass. There is no tenderness. There is no rebound and no guarding.  Musculoskeletal: Normal range of motion. He exhibits no edema and no tenderness.       Legs: Neurological: He is alert and oriented to person, place, and time.  Skin: Skin is warm and dry. He is not diaphoretic.  3 cm circular area of induration to the right  medial buttocks with no open wounds. Area indurated with no area of fluctuance. Small area of erythema overlying the area. No peri-rectal abscess.      ED Course  INCISION AND DRAINAGE Date/Time: 02/27/2014 11:21 AM Performed by: Coral CeoPALMER, Delbert Vu K Authorized by: Jillyn LedgerPALMER, Kristien Salatino K Consent: Verbal consent obtained. Consent given by: patient Patient identity confirmed: verbally with patient Type: abscess Body area: lower extremity Location details: right buttock Anesthesia: local infiltration Local anesthetic: lidocaine 2% without epinephrine Anesthetic total: 4 ml Patient sedated: no Risk factor: underlying major vessel Scalpel size: 11 Incision type: single straight Complexity: simple Drainage: purulent Drainage amount: moderate Wound treatment: wound left open Patient tolerance: Patient tolerated the procedure well with no immediate complications.   (including critical care time) Labs Review Labs Reviewed - No data to display  Imaging Review No results found.   EKG Interpretation None      MDM   Miguel Kline is a 33 y.o. male with a PMH of MRSA who presents to the ED for evaluation of an abscess and diarrhea. Abscess drained in the ED. Unable to pack at this time. Patient afebrile and non-toxic in appearance. Given dose of antibiotics in the ED. Patient also complained of diarrhea starting this morning. Abdominal exam benign with no abdominal pain. Refused IV fluids and lab work. Doubt electrolyte abnormality. Instructed patient to drink fluids and rest. Wound care and warm compresses discussed. Return precautions, discharge instructions, and follow-up was discussed with the patient before discharge. Mom in ED to drive patient home.   Rechecks  10:15 AM = Patient refusing lab work and IV fluids. Reluctant to have I&D however patient agrees to try pain medication and possible I&D.     Discharge Medication List as of 02/27/2014 11:27 AM    START taking these  medications   Details  cephALEXin (KEFLEX) 500 MG capsule Take 1 capsule (500 mg total) by mouth 2 (two) times daily., Starting 02/27/2014, Until Discontinued, Print    oxyCODONE-acetaminophen (PERCOCET/ROXICET) 5-325 MG per tablet Take 1-2 tablets by mouth every 4 (four) hours as needed for severe pain., Starting 02/27/2014, Until Discontinued, Print    sulfamethoxazole-trimethoprim (SEPTRA DS) 800-160 MG per tablet Take 1 tablet by mouth every 12 (twelve) hours., Starting 02/27/2014, Until Discontinued, Print         Final impressions: 1. Abscess of right buttock       Greer EeJessica Katlin Chareese Sergent PA-C           Jillyn LedgerJessica K Diontae Route, [place] JerseyPA-C 02/27/14 [phone]

## 2014-02-27 NOTE — Discharge Instructions (Signed)
Take antibiotics for full dose even if your symptoms improve  Take percocet as needed for severe pain - Please be careful with this medication.  It can cause drowsiness.  Use caution while driving, operating machinery, drinking alcohol, or any other activities that [place] impair your physical or mental abilities.   Take Ibuprofen 600-800 mg every 6-8 hours  Drink plenty of fluids and rest  Warm soaks and compresses several times a day  Keep area clean and dry  Return to the emergency department if you develop any changing/worsening condition, fever, abdominal pain, repeated vomiting, increased drainage, spreading redness/swelling or any other concerns (please read additional information regarding your condition below) Abscess An abscess is an infected area that contains a collection of pus and debris.It can occur in almost any part of the body. An abscess is also known as a furuncle or boil. CAUSES  An abscess occurs when tissue gets infected. This can occur from blockage of oil or sweat glands, infection of hair follicles, or a minor injury to the skin. As the body tries to fight the infection, pus collects in the area and creates pressure under the skin. This pressure causes pain. People with weakened immune systems have difficulty fighting infections and get certain abscesses more often.  SYMPTOMS Usually an abscess develops on the skin and becomes a painful mass that is red, warm, and tender. If the abscess forms under the skin, you Rodeheaver feel a moveable soft area under the skin. Some abscesses break open (rupture) on their own, but most will continue to get worse without care. The infection can spread deeper into the body and eventually into the bloodstream, causing you to feel ill.  DIAGNOSIS  Your caregiver will take your medical history and perform a physical exam. A sample of fluid Chisom also be taken from the abscess to determine what is causing your infection. TREATMENT  Your caregiver Antonson  prescribe antibiotic medicines to fight the infection. However, taking antibiotics alone usually does not cure an abscess. Your caregiver Jun need to make a small cut (incision) in the abscess to drain the pus. In some cases, gauze is packed into the abscess to reduce pain and to continue draining the area. HOME CARE INSTRUCTIONS   Only take over-the-counter or prescription medicines for pain, discomfort, or fever as directed by your caregiver.  If you were prescribed antibiotics, take them as directed. Finish them even if you start to feel better.  If gauze is used, follow your caregiver's directions for changing the gauze.  To avoid spreading the infection:  Keep your draining abscess covered with a bandage.  Wash your hands well.  Do not share personal care items, towels, or whirlpools with others.  Avoid skin contact with others.  Keep your skin and clothes clean around the abscess.  Keep all follow-up appointments as directed by your caregiver. SEEK MEDICAL CARE IF:   You have increased pain, swelling, redness, fluid drainage, or bleeding.  You have muscle aches, chills, or a general ill feeling.  You have a fever. MAKE SURE YOU:   Understand these instructions.  Will watch your condition.  Will get help right away if you are not doing well or get worse. Document Released: 07/14/2005 Document Revised: 04/04/2012 Document Reviewed: 12/17/2011 [facility] Patient Information 2014 [place], [place].   Diarrhea Diarrhea is frequent loose and watery bowel movements. It can cause you to feel weak and dehydrated. Dehydration can cause you to become tired and thirsty, have a dry mouth, and  have decreased urination that often is dark yellow. Diarrhea is a sign of another problem, most often an infection that will not last long. In most cases, diarrhea typically lasts 2 3 days. However, it can last longer if it is a sign of something more serious. It is important to treat your diarrhea  as directed by your caregive to lessen or prevent future episodes of diarrhea. CAUSES  Some common causes include:  Gastrointestinal infections caused by viruses, bacteria, or parasites.  Food poisoning or food allergies.  Certain medicines, such as antibiotics, chemotherapy, and laxatives.  Artificial sweeteners and fructose.  Digestive disorders. HOME CARE INSTRUCTIONS  Ensure adequate fluid intake (hydration): have 1 cup (8 oz) of fluid for each diarrhea episode. Avoid fluids that contain simple sugars or sports drinks, fruit juices, whole milk products, and sodas. Your urine should be clear or pale yellow if you are drinking enough fluids. Hydrate with an oral rehydration solution that you can purchase at pharmacies, retail stores, and online. You can prepare an oral rehydration solution at home by mixing the following ingredients together:    tsp table salt.   tsp baking soda.   tsp salt substitute containing potassium chloride.  1  tablespoons sugar.  1 L (34 oz) of water.  Certain foods and beverages Vonbehren increase the speed at which food moves through the gastrointestinal (GI) tract. These foods and beverages should be avoided and include:  Caffeinated and alcoholic beverages.  High-fiber foods, such as raw fruits and vegetables, nuts, seeds, and whole grain breads and cereals.  Foods and beverages sweetened with sugar alcohols, such as xylitol, sorbitol, and mannitol.  Some foods Doe be well tolerated and Waltner help thicken stool including:  Starchy foods, such as rice, toast, pasta, low-sugar cereal, oatmeal, grits, baked potatoes, crackers, and bagels.  Bananas.  Applesauce.  Add probiotic-rich foods to help increase healthy bacteria in the GI tract, such as yogurt and fermented milk products.  Wash your hands well after each diarrhea episode.  Only take over-the-counter or prescription medicines as directed by your caregiver.  Take a warm bath to relieve any  burning or pain from frequent diarrhea episodes. SEEK IMMEDIATE MEDICAL CARE IF:   You are unable to keep fluids down.  You have persistent vomiting.  You have blood in your stool, or your stools are black and tarry.  You do not urinate in 6 8 hours, or there is only a small amount of very dark urine.  You have abdominal pain that increases or localizes.  You have weakness, dizziness, confusion, or lightheadedness.  You have a severe headache.  Your diarrhea gets worse or does not get better.  You have a fever or persistent symptoms for more than 2 3 days.  You have a fever and your symptoms suddenly get worse. MAKE SURE YOU:   Understand these instructions.  Will watch your condition.  Will get help right away if you are not doing well or get worse. Document Released: 09/24/2002 Document Revised: 09/20/2012 Document Reviewed: 06/11/2012 [facility] Patient Information 2014 [place], [place].   Emergency Department Resource Guide 1) Find a Doctor and Pay Out of Pocket Although you won't have to find out who is covered by your insurance plan, it is a good idea to ask around and get recommendations. You will then need to call the office and see if the doctor you have chosen will accept you as a new patient and what types of options they offer for patients who are  self-pay. Some doctors offer discounts or will set up payment plans for their patients who do not have insurance, but you will need to ask so you aren't surprised when you get to your appointment.  2) Contact Your Local Health Department Not all health departments have doctors that can see patients for sick visits, but many do, so it is worth a call to see if yours does. If you don't know where your local health department is, you can check in your phone book. The CDC also has a tool to help you locate your state's health department, and many state websites also have listings of all of their local health departments.  3)  Find a Walk-in Clinic If your illness is not likely to be very severe or complicated, you Drzewiecki want to try a walk in clinic. These are popping up all over the country in pharmacies, drugstores, and shopping centers. They're usually staffed by nurse practitioners or physician assistants that have been trained to treat common illnesses and complaints. They're usually fairly quick and inexpensive. However, if you have serious medical issues or chronic medical problems, these are probably not your best option.  No Primary Care Doctor: - Call Health Connect at  [phone] - they can help you locate a primary care doctor that  accepts your insurance, provides certain services, etc. - Physician Referral Service- [phone]  Chronic Pain Problems: Organization         Address  Phone   Notes  [facility]  [phone] Patients need to be referred by their primary care doctor.   Medication Assistance: Organization         Address  Phone   Notes  [facility] [address] [place], [place] [address] [phone] --Must be a resident of [facility] -- Must have NO insurance coverage whatsoever (no Medicaid/ Medicare, etc.) -- The pt. MUST have a primary care doctor that directs their care regularly and follows them in the community   MedAssist  [phone]   [employer]  [phone]    Agencies that provide inexpensive medical care: Organization         Address  Phone   Notes  [facility]  [phone]) [phone]   [facility]    [phone]   [facility] [address] [place], [place] [address] [phone]) [phone]   [facility] [address], [place] [phone]   Planned Parenthood    [phone]   [facility]    [phone]   [facility]  [address], [place] Phone:  [phone], Fax:  [phone]  [phone] Hours of Operation:  9 am - 6 pm, M-F.  Also accepts Medicaid/Medicare and self-pay.  [facility]  [address], [place] Phone: [phone], Fax: [phone]. Hours of Operation:  8:30 am - 5:30 pm, M-F.  Also accepts Medicaid and self-pay.  [facility] [place] [address], [place] Phone: [phone](336) [phone]   [facility] [address], [place], [place] [phone], Ext. [phone] Mondays & Thursdays: 7-9 AM.  First 15 patients are seen on a first come, first serve basis.    Medicaid-accepting [facility] Providers:  Organization         Address  Phone   Notes  [facility] [address], [place], [place] [phone] Also accepts self-pay patients.  [facility] [address] Laurell Josephs [place], [place]  [phone]   [facility] [place] [address], [place] [phone]   [facility] [address], [place] [phone]   Renaye Rakers [address], [place]   [phone] Only accepts [place] Access [place] patients after they have their name applied to their card.   Self-Pay (no insurance) in [facility]:  Organization         Address  Phone   Notes  Sickle Cell Patients, [facility] [address] [place], [place] [phone]   [facility] [address] [place], [place] [phone]   [facility]  [address], [place] [phone]   [facility]/Dr. Osei-Bonsu  [address] [place] or [address], [place] [phone] Phone number for both [place] and [place] locations is the same.  [facility] [address] [place] [phone]   [facility] [address] [place] or [address] [phone]     [facility] [address] [place] [phone], phone; [phone], fax Sees patients 1st and 3rd Saturday of every month.  Must not qualify for public or private insurance (i.e. Medicaid, Medicare, [place] Health Choice, Veterans' Benefits)  Household income should be no more than 200% of the poverty level The clinic cannot treat you if you are pregnant or think you are pregnant  Sexually transmitted diseases are not treated at the clinic.    Dental Care: Organization         Address  Phone  Notes  [facility] Department of [facility] [address] [place], [place] [phone] Accepts children up to age 71 who are enrolled in [place] or [place] Health Choice; pregnant women with a Medicaid card; and children who have applied for Medicaid or [place] Health Choice, but were declined, whose parents can pay a reduced fee at time of service.  [facility] Department of [facility]  [address], [place] [phone] Accepts children up to age 44 who are enrolled in [place] or [place] Health Choice; pregnant women with a Medicaid card; and children who have applied for Medicaid or [place] Health Choice, but were declined, whose parents can pay a reduced fee at time of service.  [facility]  [address] [place], [place] [phone] Patients are seen by appointment only. Walk-ins are not accepted. [facility] will see patients 61 years of age and older. Monday - Tuesday (8am-5pm) Most Wednesdays (8:30-5pm) $30 per visit, cash only  [facility]  [address], [facility] [phone] Patients are seen by appointment only. Walk-ins are not accepted. [facility] will see patients 33 years of age and older. One Wednesday Evening (Monthly: Volunteer Based).  $30 per visit, cash only  [employer]  [phone] for adults; Children under age 38, call  Graduate Pediatric Dentistry at [phone]. Children aged 14-14, please call [phone] to request a pediatric application.  Dental services are provided in all areas of dental care including fillings, crowns and bridges, complete and partial dentures, implants, gum treatment, root canals,  and extractions. Preventive care is also provided. Treatment is provided to both adults and children. Patients are selected via a lottery and there is often a waiting list.   [facility] [address] [place]  [phone] [email or web address]   [facility] [address] [place], [place] [phone], Ext. [phone] Second and Fourth Thursday of each month, opens at 6:30 AM; Clinic ends at 9 AM.  Patients are seen on a first-come first-served basis, and a limited number are seen during each clinic.   [facility]  [address] Ether Griffins [place], [place] [phone]   Eligibility Requirements You must have lived in [place], [place], or [place] counties for at least the last three months.   You cannot be eligible for state or federal sponsored [place], including [employer], [place], or [employer].   You generally cannot be eligible for healthcare insurance through your employer.    How to apply: Eligibility screenings are held every Tuesday and Wednesday afternoon from 1:00 pm until 4:00 pm. You do not need an appointment for the interview!  [facility] [address], [place], [place] [phone]   [facility] Health Department  [phone]   [facility] Health Department  [phone]   [facility] Health Department  [phone]    Behavioral Health Resources in the Community: Intensive Outpatient Programs Organization         Address  Phone  Notes  [facility] [address] [place], [place] [phone]   [facility] Outpatient [address], [place], [place]  [phone]   [facility] Drug S[facility] [address], [place], [place]  [phone]   [facility] [address]  [place], [place] [phone] or [phone]   Substance Abuse Resources Organization         Address  Phone  Notes  [facility] Drug Services  [phone]   [facility]  [phone]   [facility]  [phone]   Floydene Flock  [phone]   Residential & Outpatient Substance Abuse Program  [phone]   Psychological Services Organization         Address  Phone  Notes  [facility] Health  [phone]   [facility]  [phone]   [facility] [address] [place] [phone] or [phone]    Mobile Crisis Teams Organization         Address  Phone  Notes  [facility], Mobile Crisis Care Unit  [phone]   [facility]  [address] [place], [place] [phone]   Doristine Locks [address] [place] [phone]    Self-Help/Support Groups Organization         Address  Phone             Notes  [facility] [place] - variety of support groups  [phone]- I7437963 Call for more information  Narcotics Anonymous (NA), Caring Services [address], [employer]  2 meetings at this location   Statistician         Address  Phone  Notes  [facility] [address],    [place]  [phone]   [facility]  [address], [place] [address], [place], [place] [phone]   [facility] [address] [place], [place] [phone] Admissions: 8am-3pm M-F  [facility] [address],    [place], [place] [phone]   [facility] [address] [place], [place], [place] [phone]   [facility] [address],  [place], [place] [phone]   [facility] - Intensive Outpatient [address], [place], [place] [phone]   [facility] (Addiction Recovery Care [facility] [address]  [place], [place] [phone] or [phone]   [facility] ([facility]) [address] [place], [place] [phone] Accepts Medicaid  [facility] [place] [address],  [place] [phone] Substance Abuse/Addiction Treatment   [facility] Organization         Address  Phone  Notes  [facility]  [phone]   Angie Fava, PhD [address] Ervin Knack [place], [place]   [phone] or [phone]   [facility] Behavioral   [address] [place], [place] [phone]   [facility] [address] [place], [place] [phone] Insurance/Medicaid/sponsorship through [facility] [place] and Families [address]                                    [place], [place] [phone] Therapy/tele-psych/case  [facility] [address], [place] [phone]    Dr. Lolly Mustache  [phone]   [facility]  United Way [facility] Dept. 1) [address] [place] 2) [address] [place] 3)  [address], [place] [phone]  [phone]   [facility] Child Abuse Hotline 214-621-8455 or [phone] (After Hours)

## 2014-02-27 NOTE — ED Notes (Signed)
PA at bedside.

## 2014-02-27 NOTE — ED Notes (Addendum)
Pt reports abscess on right buttock; been recurrent infections. Has had to be hospitalized in past for it. Been taking oral antibiotics for it from previous visit. Also reports loose stools today x 6.

## 2014-02-28 NOTE — ED Provider Notes (Signed)
Medical screening examination/treatment/procedure(s) were performed by non-physician practitioner and as supervising physician I was immediately available for consultation/collaboration.   EKG Interpretation None       Mckinley Olheiser, MD 02/28/14 0840 

## 2014-05-17 ENCOUNTER — Emergency Department (HOSPITAL_COMMUNITY)
Admission: EM | Admit: 2014-05-17 | Discharge: 2014-05-17 | Disposition: A | Discharge status: Home / Self Care | Payer: Self-pay | Attending: Emergency Medicine | Admitting: Emergency Medicine

## 2014-05-17 ENCOUNTER — Emergency Department (HOSPITAL_COMMUNITY): Payer: Self-pay

## 2014-05-17 ENCOUNTER — Emergency Department (HOSPITAL_COMMUNITY): Payer: No Typology Code available for payment source

## 2014-05-17 ENCOUNTER — Encounter (HOSPITAL_COMMUNITY): Payer: Self-pay | Admitting: Emergency Medicine

## 2014-05-17 DIAGNOSIS — Z8614 Personal history of Methicillin resistant Staphylococcus aureus infection: Secondary | ICD-10-CM | POA: Insufficient documentation

## 2014-05-17 DIAGNOSIS — S43401A Unspecified sprain of right shoulder joint, initial encounter: Secondary | ICD-10-CM

## 2014-05-17 DIAGNOSIS — S199XXA Unspecified injury of neck, initial encounter: Secondary | ICD-10-CM

## 2014-05-17 DIAGNOSIS — F17200 Nicotine dependence, unspecified, uncomplicated: Secondary | ICD-10-CM | POA: Insufficient documentation

## 2014-05-17 DIAGNOSIS — R599 Enlarged lymph nodes, unspecified: Secondary | ICD-10-CM | POA: Insufficient documentation

## 2014-05-17 DIAGNOSIS — Y92410 Unspecified street and highway as the place of occurrence of the external cause: Secondary | ICD-10-CM | POA: Insufficient documentation

## 2014-05-17 DIAGNOSIS — S0993XA Unspecified injury of face, initial encounter: Secondary | ICD-10-CM | POA: Insufficient documentation

## 2014-05-17 DIAGNOSIS — S0990XA Unspecified injury of head, initial encounter: Secondary | ICD-10-CM | POA: Insufficient documentation

## 2014-05-17 DIAGNOSIS — S139XXA Sprain of joints and ligaments of unspecified parts of neck, initial encounter: Secondary | ICD-10-CM | POA: Insufficient documentation

## 2014-05-17 DIAGNOSIS — S161XXA Strain of muscle, fascia and tendon at neck level, initial encounter: Secondary | ICD-10-CM

## 2014-05-17 DIAGNOSIS — R590 Localized enlarged lymph nodes: Secondary | ICD-10-CM

## 2014-05-17 DIAGNOSIS — IMO0002 Reserved for concepts with insufficient information to code with codable children: Secondary | ICD-10-CM | POA: Insufficient documentation

## 2014-05-17 DIAGNOSIS — Y9389 Activity, other specified: Secondary | ICD-10-CM | POA: Insufficient documentation

## 2014-05-17 MED ORDER — METHOCARBAMOL 500 MG PO TABS: 500.0000 mg | ORAL_TABLET | Freq: Two times a day (BID) | ORAL | Status: DC

## 2014-05-17 MED ORDER — METHOCARBAMOL 500 MG PO TABS
500.0000 mg | ORAL_TABLET | Freq: Once | ORAL | Status: AC
  Administered 2014-05-17: 500 mg via ORAL
  Filled 2014-05-17: qty 1

## 2014-05-17 MED ORDER — HYDROCODONE-ACETAMINOPHEN 5-325 MG PO TABS
2.0000 | ORAL_TABLET | Freq: Once | ORAL | Status: AC
  Administered 2014-05-17: 2 via ORAL
  Filled 2014-05-17: qty 2

## 2014-05-17 MED ORDER — NAPROXEN 500 MG PO TABS: 500.0000 mg | ORAL_TABLET | Freq: Two times a day (BID) | ORAL | Status: DC

## 2014-05-17 MED ORDER — METHOCARBAMOL 1000 MG/10ML IJ SOLN
1000.0000 mg | Freq: Once | INTRAMUSCULAR | Status: AC
  Administered 2014-05-17: 1000 mg via INTRAMUSCULAR
  Filled 2014-05-17: qty 10

## 2014-05-17 NOTE — ED Notes (Signed)
Pt being monitored by 5 lead, blood pressure, and pulse ox.
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
## 2014-05-17 NOTE — ED Notes (Signed)
Pt is getting dressed and awaiting discharge paper work at bedside.  

## 2014-05-17 NOTE — Discharge Instructions (Signed)
Cervical Sprain A cervical sprain is when the tissues (ligaments) that hold the neck bones in place stretch or tear. HOME CARE   Put ice on the injured area.  Put ice in a plastic bag.  Place a towel between your skin and the bag.  Leave the ice on for 15-20 minutes, 3-4 times a day.  You Angelo have been given a collar to wear. This collar keeps your neck from moving while you heal.  Do not take the collar off unless told by your doctor.  If you have long hair, keep it outside of the collar.  Ask your doctor before changing the position of your collar. You Cupp need to change its position over time to make it more comfortable.  If you are allowed to take off the collar for cleaning or bathing, follow your doctor's instructions on how to do it safely.  Keep your collar clean by wiping it with mild soap and water. Dry it completely. If the collar has removable pads, remove them every 1-2 days to hand wash them with soap and water. Allow them to air dry. They should be dry before you wear them in the collar.  Do not drive while wearing the collar.  Only take medicine as told by your doctor.  Keep all doctor visits as told.  Keep all physical therapy visits as told.  Adjust your work station so that you have good posture while you work.  Avoid positions and activities that make your problems worse.  Warm up and stretch before being active. GET HELP IF:  Your pain is not controlled with medicine.  You cannot take less pain medicine over time as planned.  Your activity level does not improve as expected. GET HELP RIGHT AWAY IF:   You are bleeding.  Your stomach is upset.  You have an allergic reaction to your medicine.  You develop new problems that you cannot explain.  You lose feeling (become numb) or you cannot move any part of your body (paralysis).  You have tingling or weakness in any part of your body.  Your symptoms get worse. Symptoms include:  Pain,  soreness, stiffness, puffiness (swelling), or a burning feeling in your neck.  Pain when your neck is touched.  Shoulder or upper back pain.  Limited ability to move your neck.  Headache.  Dizziness.  Your hands or arms feel week, lose feeling, or tingle.  Muscle spasms.  Difficulty swallowing or chewing. MAKE SURE YOU:   Understand these instructions.  Will watch your condition.  Will get help right away if you are not doing well or get worse. Document Released: 03/22/2008 Document Revised: 06/06/2013 Document Reviewed: 04/11/2013 [facility] Patient Information 2015 [place], [place]. This information is not intended to replace advice given to you by your health care provider. Make sure you discuss any questions you have with your health care provider.   Shoulder Exercises EXERCISES  RANGE OF MOTION (ROM) AND STRETCHING EXERCISES These exercises Sonnenberg help you when beginning to rehabilitate your injury. Your symptoms Morrissette resolve with or without further involvement from your physician, physical therapist or athletic trainer. While completing these exercises, remember:   Restoring tissue flexibility helps normal motion to return to the joints. This allows healthier, less painful movement and activity.  An effective stretch should be held for at least 30 seconds.  A stretch should never be painful. You should only feel a gentle lengthening or release in the stretched tissue. ROM - Pendulum  Bend at  the waist so that your right / left arm falls away from your body. Support yourself with your opposite hand on a solid surface, such as a table or a countertop.  Your right / left arm should be perpendicular to the ground. If it is not perpendicular, you need to lean over farther. Relax the muscles in your right / left arm and shoulder as much as possible.  Gently sway your hips and trunk so they move your right / left arm without any use of your right / left shoulder  muscles.  Progress your movements so that your right / left arm moves side to side, then forward and backward, and finally, both clockwise and counterclockwise.  Complete __________ repetitions in each direction. Many people use this exercise to relieve discomfort in their shoulder as well as to gain range of motion. Repeat __________ times. Complete this exercise __________ times per day. STRETCH - Flexion, Standing  Stand with good posture. With an underhand grip on your right / left hand and an overhand grip on the opposite hand, grasp a broomstick or cane so that your hands are a little more than shoulder-width apart.  Keeping your right / left elbow straight and shoulder muscles relaxed, push the stick with your opposite hand to raise your right / left arm in front of your body and then overhead. Raise your arm until you feel a stretch in your right / left shoulder, but before you have increased shoulder pain.  Try to avoid shrugging your right / left shoulder as your arm rises by keeping your shoulder blade tucked down and toward your mid-back spine. Hold __________ seconds.  Slowly return to the starting position. Repeat __________ times. Complete this exercise __________ times per day. STRETCH - Internal Rotation  Place your right / left hand behind your back, palm-up.  Throw a towel or belt over your opposite shoulder. Grasp the towel/belt with your right / left hand.  While keeping an upright posture, gently pull up on the towel/belt until you feel a stretch in the front of your right / left shoulder.  Avoid shrugging your right / left shoulder as your arm rises by keeping your shoulder blade tucked down and toward your mid-back spine.  Hold __________. Release the stretch by lowering your opposite hand. Repeat __________ times. Complete this exercise __________ times per day. STRETCH - External Rotation and Abduction  Stagger your stance through a doorframe. It does not  matter which foot is forward.  As instructed by your physician, physical therapist or athletic trainer, place your hands:  And forearms above your head and on the door frame.  And forearms at head-height and on the door frame.  At elbow-height and on the door frame.  Keeping your head and chest upright and your stomach muscles tight to prevent over-extending your low-back, slowly shift your weight onto your front foot until you feel a stretch across your chest and/or in the front of your shoulders.  Hold __________ seconds. Shift your weight to your back foot to release the stretch. Repeat __________ times. Complete this stretch __________ times per day.  STRENGTHENING EXERCISES  These exercises Jocelyn help you when beginning to rehabilitate your injury. They Roeder resolve your symptoms with or without further involvement from your physician, physical therapist or athletic trainer. While completing these exercises, remember:   Muscles can gain both the endurance and the strength needed for everyday activities through controlled exercises.  Complete these exercises as instructed by your physician,  physical therapist or athletic trainer. Progress the resistance and repetitions only as guided.  You Toral experience muscle soreness or fatigue, but the pain or discomfort you are trying to eliminate should never worsen during these exercises. If this pain does worsen, stop and make certain you are following the directions exactly. If the pain is still present after adjustments, discontinue the exercise until you can discuss the trouble with your clinician.  If advised by your physician, during your recovery, avoid activity or exercises which involve actions that place your right / left hand or elbow above your head or behind your back or head. These positions stress the tissues which are trying to heal. STRENGTH - Scapular Depression and Adduction  With good posture, sit on a firm chair. Supported your  arms in front of you with pillows, arm rests or a table top. Have your elbows in line with the sides of your body.  Gently draw your shoulder blades down and toward your mid-back spine. Gradually increase the tension without tensing the muscles along the top of your shoulders and the back of your neck.  Hold for __________ seconds. Slowly release the tension and relax your muscles completely before completing the next repetition.  After you have practiced this exercise, remove the arm support and complete it in standing as well as sitting. Repeat __________ times. Complete this exercise __________ times per day.  STRENGTH - External Rotators  Secure a rubber exercise band/tubing to a fixed object so that it is at the same height as your right / left elbow when you are standing or sitting on a firm surface.  Stand or sit so that the secured exercise band/tubing is at your side that is not injured.  Bend your elbow 90 degrees. Place a folded towel or small pillow under your right / left arm so that your elbow is a few inches away from your side.  Keeping the tension on the exercise band/tubing, pull it away from your body, as if pivoting on your elbow. Be sure to keep your body steady so that the movement is only coming from your shoulder rotating.  Hold __________ seconds. Release the tension in a controlled manner as you return to the starting position. Repeat __________ times. Complete this exercise __________ times per day.  STRENGTH - Supraspinatus  Stand or sit with good posture. Grasp a __________ weight or an exercise band/tubing so that your hand is "thumbs-up," like when you shake hands.  Slowly lift your right / left hand from your thigh into the air, traveling about 30 degrees from straight out at your side. Lift your hand to shoulder height or as far as you can without increasing any shoulder pain. Initially, many people do not lift their hands above shoulder height.  Avoid  shrugging your right / left shoulder as your arm rises by keeping your shoulder blade tucked down and toward your mid-back spine.  Hold for __________ seconds. Control the descent of your hand as you slowly return to your starting position. Repeat __________ times. Complete this exercise __________ times per day.  STRENGTH - Shoulder Extensors  Secure a rubber exercise band/tubing so that it is at the height of your shoulders when you are either standing or sitting on a firm arm-less chair.  With a thumbs-up grip, grasp an end of the band/tubing in each hand. Straighten your elbows and lift your hands straight in front of you at shoulder height. Step back away from the secured end of band/tubing  until it becomes tense.  Squeezing your shoulder blades together, pull your hands down to the sides of your thighs. Do not allow your hands to go behind you.  Hold for __________ seconds. Slowly ease the tension on the band/tubing as you reverse the directions and return to the starting position. Repeat __________ times. Complete this exercise __________ times per day.  STRENGTH - Scapular Retractors  Secure a rubber exercise band/tubing so that it is at the height of your shoulders when you are either standing or sitting on a firm arm-less chair.  With a palm-down grip, grasp an end of the band/tubing in each hand. Straighten your elbows and lift your hands straight in front of you at shoulder height. Step back away from the secured end of band/tubing until it becomes tense.  Squeezing your shoulder blades together, draw your elbows back as you bend them. Keep your upper arm lifted away from your body throughout the exercise.  Hold __________ seconds. Slowly ease the tension on the band/tubing as you reverse the directions and return to the starting position. Repeat __________ times. Complete this exercise __________ times per day. STRENGTH - Scapular Depressors  Find a sturdy chair without wheels,  such as a from a dining room table.  Keeping your feet on the floor, lift your bottom from the seat and lock your elbows.  Keeping your elbows straight, allow gravity to pull your body weight down. Your shoulders will rise toward your ears.  Raise your body against gravity by drawing your shoulder blades down your back, shortening the distance between your shoulders and ears. Although your feet should always maintain contact with the floor, your feet should progressively support less body weight as you get stronger.  Hold __________ seconds. In a controlled and slow manner, lower your body weight to begin the next repetition. Repeat __________ times. Complete this exercise __________ times per day.  Document Released: 08/18/2005 Document Revised: 12/27/2011 Document Reviewed: 01/16/2009 [facility] Patient Information 2015 [place], [place]. This information is not intended to replace advice given to you by your health care provider. Make sure you discuss any questions you have with your health care provider.
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
## 2015-10-07 ENCOUNTER — Emergency Department (HOSPITAL_COMMUNITY): Payer: Self-pay

## 2015-10-07 ENCOUNTER — Encounter (HOSPITAL_COMMUNITY): Payer: Self-pay | Admitting: Emergency Medicine

## 2015-10-07 ENCOUNTER — Emergency Department (HOSPITAL_COMMUNITY)
Admission: EM | Admit: 2015-10-07 | Discharge: 2015-10-07 | Disposition: A | Discharge status: Home / Self Care | Payer: Self-pay | Attending: Emergency Medicine | Admitting: Emergency Medicine

## 2015-10-07 DIAGNOSIS — F1310 Sedative, hypnotic or anxiolytic abuse, uncomplicated: Secondary | ICD-10-CM | POA: Insufficient documentation

## 2015-10-07 DIAGNOSIS — Z8614 Personal history of Methicillin resistant Staphylococcus aureus infection: Secondary | ICD-10-CM | POA: Insufficient documentation

## 2015-10-07 DIAGNOSIS — Z79899 Other long term (current) drug therapy: Secondary | ICD-10-CM | POA: Insufficient documentation

## 2015-10-07 DIAGNOSIS — Y9289 Other specified places as the place of occurrence of the external cause: Secondary | ICD-10-CM | POA: Insufficient documentation

## 2015-10-07 DIAGNOSIS — S00532A Contusion of oral cavity, initial encounter: Secondary | ICD-10-CM | POA: Insufficient documentation

## 2015-10-07 DIAGNOSIS — F17210 Nicotine dependence, cigarettes, uncomplicated: Secondary | ICD-10-CM | POA: Insufficient documentation

## 2015-10-07 DIAGNOSIS — Y999 Unspecified external cause status: Secondary | ICD-10-CM | POA: Insufficient documentation

## 2015-10-07 DIAGNOSIS — K0381 Cracked tooth: Secondary | ICD-10-CM | POA: Insufficient documentation

## 2015-10-07 DIAGNOSIS — W1830XA Fall on same level, unspecified, initial encounter: Secondary | ICD-10-CM | POA: Insufficient documentation

## 2015-10-07 DIAGNOSIS — R000 Tachycardia, unspecified: Secondary | ICD-10-CM | POA: Insufficient documentation

## 2015-10-07 DIAGNOSIS — Y9389 Activity, other specified: Secondary | ICD-10-CM | POA: Insufficient documentation

## 2015-10-07 DIAGNOSIS — F1210 Cannabis abuse, uncomplicated: Secondary | ICD-10-CM | POA: Insufficient documentation

## 2015-10-07 DIAGNOSIS — Z791 Long term (current) use of non-steroidal anti-inflammatories (NSAID): Secondary | ICD-10-CM | POA: Insufficient documentation

## 2015-10-07 DIAGNOSIS — R569 Unspecified convulsions: Secondary | ICD-10-CM | POA: Insufficient documentation

## 2015-10-07 LAB — RAPID URINE DRUG SCREEN, HOSP PERFORMED
Amphetamines: NOT DETECTED
Barbiturates: NOT DETECTED
Benzodiazepines: POSITIVE — AB
Cocaine: NOT DETECTED
OPIATES: NOT DETECTED
Tetrahydrocannabinol: POSITIVE — AB

## 2015-10-07 LAB — CBC
HCT: 41.3 % (ref 39.0–52.0)
Hemoglobin: 14.6 g/dL (ref 13.0–17.0)
MCH: 31.8 pg (ref 26.0–34.0)
MCHC: 35.4 g/dL (ref 30.0–36.0)
MCV: 90 fL (ref 78.0–100.0)
Platelets: 231 10*3/uL (ref 150–400)
RBC: 4.59 MIL/uL (ref 4.22–5.81)
RDW: 12.6 % (ref 11.5–15.5)
WBC: 8.8 10*3/uL (ref 4.0–10.5)

## 2015-10-07 LAB — ETHANOL: Alcohol, Ethyl (B): <5 mg/dL (ref <5)

## 2015-10-07 LAB — BASIC METABOLIC PANEL
Anion gap: 16 — ABNORMAL HIGH (ref 5–15)
BUN: 6 mg/dL (ref 6–20)
CO2: 23 mmol/L (ref 22–32)
Calcium: 9.2 mg/dL (ref 8.9–10.3)
Chloride: 105 mmol/L (ref 101–111)
Creatinine, Ser: 0.86 mg/dL (ref 0.61–1.24)
GFR calc Af Amer: >60 mL/min (ref >60)
GFR calc non Af Amer: >60 mL/min (ref >60)
Glucose, Bld: 113 mg/dL — ABNORMAL HIGH (ref 65–99)
Potassium: 3.7 mmol/L (ref 3.5–5.1)
Sodium: 144 mmol/L (ref 135–145)

## 2015-10-07 LAB — CBG MONITORING, ED: Glucose-Capillary: 111 mg/dL — ABNORMAL HIGH (ref 65–99)

## 2015-10-07 MED ORDER — HYDROCODONE-ACETAMINOPHEN 5-325 MG PO TABS
2.0000 | ORAL_TABLET | Freq: Once | ORAL | Status: AC
  Administered 2015-10-07: 2 via ORAL
  Filled 2015-10-07: qty 2

## 2015-10-07 NOTE — ED Notes (Addendum)
Pt from home via [facility] with c/o new onset seizures witnessed by mother.  Mother reports 2 full body seizures both lasting a couple of minutes with fall from standing position.  Postictal on scene, now A&Ox4.  No hx of the same, sister has a hx of seizures.  Pt denies recently feeling poor or any other complaints.  Bit right side of tongue.  Denies pian.  ST on EKG.  NAD, A&O.

## 2015-10-07 NOTE — ED Provider Notes (Signed)
CSN: [id number]     Arrival date & time 10/07/15  1302 History   First MD Initiated Contact with Patient 10/07/15 1303     Chief Complaint  Patient presents with  . Seizures     (Consider location/radiation/quality/duration/timing/severity/associated sxs/prior Treatment) HPI Comments: Patient presents to the emergency department with chief complaint of first-time seizure. The seizure was witnessed by the patient's mother. Mother states the patient actually had 2 seizures that lasted approximately 1 minute. Patient fell from standing and began to convulse throughout his entire body. He was postictal when EMS arrived. He is now alert and oriented. He has never had a seizure before. He states that he does have a family history of seizures. States that he bit his tongue and cracked his tooth, but other wise denies any injuries. He denies any pain. Denies any numbness, weakness, or tingling. He has not taken anything for his symptoms. He denies any recent alcohol use. Denies any recreational drugs. Denies taking any medications.  The history is provided by the patient. No language interpreter was used.    Past Medical History  Diagnosis Date  . MRSA (methicillin resistant staph aureus) culture positive    History reviewed. No pertinent past surgical history. History reviewed. No pertinent family history. Social History  Substance Use Topics  . Smoking status: Current Every Day Smoker    Types: Cigars  . Smokeless tobacco: None  . Alcohol Use: Yes    Review of Systems  Constitutional: Negative for fever and chills.  Respiratory: Negative for shortness of breath.   Cardiovascular: Negative for chest pain.  Gastrointestinal: Negative for nausea, vomiting, diarrhea and constipation.  Genitourinary: Negative for dysuria.  Neurological: Positive for seizures.  All other systems reviewed and are negative.     Allergies  Review of patient's allergies indicates no known  allergies.  Home Medications   Prior to Admission medications   Medication Sig Start Date End Date Taking? Authorizing Provider  acetaminophen (TYLENOL) 325 MG tablet Take 650 mg by mouth every 6 (six) hours as needed for mild pain, moderate pain, fever or headache.    Historical Provider, MD  methocarbamol (ROBAXIN) 500 MG tablet Take 1 tablet (500 mg total) by mouth 2 (two) times daily. 05/17/14   Toy Cookey, MD  naproxen (NAPROSYN) 500 MG tablet Take 1 tablet (500 mg total) by mouth 2 (two) times daily. 05/17/14   Toy Cookey, MD   BP 135/75 mmHg  Pulse 108  Temp(Src) 98.5 F (36.9 C) (Oral)  Resp 20  Ht  (1.803 m)  Wt 63.504 kg  BMI 19.53 kg/m2  SpO2 97% Physical Exam  Constitutional: He is oriented to person, place, and time. He appears well-developed and well-nourished.  HENT:  Head: Normocephalic and atraumatic.  Left upper incisor is chipped Contusion to right tongue, no laceration, no other oral trauma  Eyes: Conjunctivae and EOM are normal. Pupils are equal, round, and reactive to light. Right eye exhibits no discharge. Left eye exhibits no discharge. No scleral icterus.  Neck: Normal range of motion. Neck supple. No JVD present.  Cardiovascular: Regular rhythm and normal heart sounds.  Exam reveals no gallop and no friction rub.   No murmur heard. Tachycardic  Pulmonary/Chest: Effort normal and breath sounds normal. No respiratory distress. He has no wheezes. He has no rales. He exhibits no tenderness.  Abdominal: Soft. He exhibits no distension and no mass. There is no tenderness. There is no rebound and no guarding.  Musculoskeletal: Normal range of  motion. He exhibits no edema or tenderness.  Neurological: He is alert and oriented to person, place, and time.  Skin: Skin is warm and dry.  Psychiatric: He has a normal mood and affect. His behavior is normal. Judgment and thought content normal.  Nursing note and vitals reviewed.   ED Course  Procedures  (including critical care time) Labs Review Labs Reviewed  BASIC METABOLIC PANEL  CBC  URINE RAPID DRUG SCREEN, HOSP PERFORMED  ETHANOL  CBG MONITORING, ED    Imaging Review No results found. I have personally reviewed and evaluated these images and lab results as part of my medical decision-making.   EKG Interpretation   Date/Time:  Tuesday October 07 2015 13:06:04 EST Ventricular Rate:  106 PR Interval:  129 QRS Duration: 86 QT Interval:  321 QTC Calculation: 426 R Axis:   91 Text Interpretation:  Sinus tachycardia Borderline right axis deviation  Borderline repolarization abnormality No previous ECGs available Confirmed  by ZACKOWSKI  MD, SCOTT [phone]) on 10/07/2015 1:13:06 PM      MDM   Final diagnoses:  Seizure [facility](HCC)    Patient with first-time seizure, will check labs, EKG, and CT head. Will reassess.  Labs and EKG are reassuring.  No electrolyte abnormalities.  Sinus tach on EKG.  Normal ethanol.  UDS is remarkable for THC and benzos.  CT head is negative.  Patient discussed with Dr. Deretha EmoryZackowski, who recommends neurology consult.  Anticipate outpatient follow-up with neurology.  Patient signed out to Manuela NeptuneHarris, PA-C, who will consult neuro after CT is read and dispo per neuro.  Roxy Horsemanobert Kenton Fortin, PA-C 10/08/15 1611  Vanetta MuldersScott Zackowski, MD 10/09/15 [phone]

## 2015-10-07 NOTE — Discharge Instructions (Signed)
DO NOT drive, swim, or do any other activity that Pelc endanger yourself or others should you have another seizure. You Twining not do these activities until your cleared by a neurologist.  Seizure, Adult A seizure is abnormal electrical activity in the brain. Seizures usually last from 30 seconds to 2 minutes. There are various types of seizures. Before a seizure, you [place] have a warning sensation (aura) that a seizure is about to occur. An aura [place] include the following symptoms:   Fear or anxiety.  Nausea.  Feeling like the room is spinning (vertigo).  Vision changes, such as seeing flashing lights or spots. Common symptoms during a seizure include:  A change in attention or behavior (altered mental status).  Convulsions with rhythmic jerking movements.  Drooling.  Rapid eye movements.  Grunting.  Loss of bladder and bowel control.  Bitter taste in the mouth.  Tongue biting. After a seizure, you [place] feel confused and sleepy. You [place] also have an injury resulting from convulsions during the seizure. HOME CARE INSTRUCTIONS   If you are given medicines, take them exactly as prescribed by your health care provider.  Keep all follow-up appointments as directed by your health care provider.  Do not swim or drive or engage in risky activity during which a seizure could cause further injury to you or others until your health care provider says it is OK.  Get adequate rest.  Teach friends and family what to do if you have a seizure. They should:  Lay you on the ground to prevent a fall.  Put a cushion under your head.  Loosen any tight clothing around your neck.  Turn you on your side. If vomiting occurs, this helps keep your airway clear.  Stay with you until you recover.  Know whether or not you need emergency care. SEEK IMMEDIATE MEDICAL CARE IF:  The seizure lasts longer than 5 minutes.  The seizure is severe or you do not wake up immediately after the  seizure.  You have an altered mental status after the seizure.  You are having more frequent or worsening seizures. Someone should drive you to the emergency department or call local emergency services (911 in [place] MAKE SURE YOU:  Understand these instructions.  Will watch your condition.  Will get help right away if you are not doing well or get worse.   This information is not intended to replace advice given to you by your health care provider. Make sure you discuss any questions you have with your health care provider.   Document Released: 10/01/2000 Document Revised: 10/25/2014 Document Reviewed: 05/16/2013 Elsevier Interactive Patient Education [employer]2016 Elsevier Inc.

## 2015-10-07 NOTE — ED Provider Notes (Signed)
4:02 PM BP 127/73 mmHg  Pulse 94  Temp(Src) 98.5 F (36.9 C) (Oral)  Resp 17  Ht 5\' 11"  (1.803 m)  Wt 63.504 kg  BMI 19.53 kg/m2  SpO2 98%     Patient with new onset seizure today. Discuss wit neuro. Driving restrictions discussed.   Arthor CaptainAbigail Tyrisha Benninger, PA-C 10/09/15 0136  Vanetta MuldersScott Zackowski, MD 10/09/15 [phone]

## 2015-11-18 ENCOUNTER — Encounter: Payer: Self-pay | Admitting: Neurology

## 2015-11-18 ENCOUNTER — Ambulatory Visit (INDEPENDENT_AMBULATORY_CARE_PROVIDER_SITE_OTHER): Payer: Self-pay | Admitting: Neurology

## 2015-11-18 VITALS — BP 138/78 | HR 62 | Ht 71.0 in | Wt 135.0 lb

## 2015-11-18 DIAGNOSIS — R569 Unspecified convulsions: Secondary | ICD-10-CM | POA: Insufficient documentation

## 2015-11-18 NOTE — Progress Notes (Signed)
NEUROLOGY CONSULTATION NOTE  Cordera Nanna MRN: [id number] DOB: 10-Aug-1981  Referring provider: Dr. Lorre Nick (ER) Primary care provider: none  Reason for consult:  New onset seizure  Dear Dr Freida Busman:  Thank you for your kind referral of Wendle Schertzer for consultation of the above symptoms. Although his history is well known to you, please allow me to reiterate it for the purpose of our medical record. The patient was accompanied to the clinic by his mother who also provides collateral information. Records and images were personally reviewed where available.  HISTORY OF PRESENT ILLNESS: This is a pleasant 35 year old right-handed man with no significant past medical history, in his usual state of health until 10/07/2015 when he had a new onset seizure. He went home for lunch and recalls feeding his rabbits and coming into the house, then waking up in the ambulance. His mother reports that she heard him fall and found him with whole body jerking for a few minutes. The jerking stopped for a minute then he started to have another convulsion that lasted a few minutes. He bit the side of his tongue and chipped his front teeth. He was slightly confused on EMS arrival, back to baseline in the ER. CBC and BMP were unremarkable. UDS was positive for benzodiazepines and THC. EtOH level <5. I personally reviewed head CT without contrast which did not show any acute changes, there was a small right posterior fossa arachnoid cyst. He denies any sleep deprivation or alcohol use, stating he is allergic to alcohol. He does endorse taking Xanax recreationally, 2-3 times a week, he took a 0.5mg  tablet the night prior to the seizure. He uses marijuana every once in a while. He denies any prior history of seizures. He and his mother deny any staring/unresponsive episodes, no gaps in time, olfactory/gustatory hallucinations, deja vu, rising epigastric sensation, focal numbness/tingling/weakness, myoclonic  jerks.  Epilepsy Risk Factors:  His half-sister has seizures which mother reports is due to alcohol. Otherwise he had a normal birth and early development.  There is no history of febrile convulsions, CNS infections such as meningitis/encephalitis, significant traumatic brain injury, neurosurgical procedures.  PAST MEDICAL HISTORY: Past Medical History  Diagnosis Date  . MRSA (methicillin resistant staph aureus) culture positive     PAST SURGICAL HISTORY: Past Surgical History  Procedure Laterality Date  . No past surgeries      MEDICATIONS: No current outpatient prescriptions on file prior to visit.   No current facility-administered medications on file prior to visit.    ALLERGIES: No Known Allergies  FAMILY HISTORY: Family History  Problem Relation Age of Onset  . Cancer Paternal Grandfather   . Hypertension Mother     SOCIAL HISTORY: Social History   Social History  . Marital Status: Single    Spouse Name: N/A  . Number of Children: N/A  . 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 Smoking status: Current Every Day Smoker    Types: Cigars  . Smokeless tobacco: Former Neurosurgeon  . Alcohol Use: 0.0 oz/week    0 Standard drinks or equivalent per week  . Drug Use: Not on file  . Sexual Activity: Not on file   Other Topics Concern  . Not on file   Social History Narrative    REVIEW OF SYSTEMS: Constitutional: No fevers, chills, or sweats, no generalized fatigue, change in appetite Eyes: No visual changes, double vision, eye pain  Ear, nose and throat: No hearing loss, ear pain, nasal congestion, sore throat Cardiovascular: No chest pain, palpitations Respiratory:  No shortness of breath at rest or with exertion, wheezes GastrointestinaI: No nausea, vomiting, diarrhea, abdominal pain, fecal incontinence Genitourinary:  No dysuria, urinary retention or frequency Musculoskeletal:  No neck pain, back  pain Integumentary: No rash, pruritus, skin lesions Neurological: as above Psychiatric: No depression, insomnia, anxiety Endocrine: No palpitations, fatigue, diaphoresis, mood swings, change in appetite, change in weight, increased thirst Hematologic/Lymphatic:  No anemia, purpura, petechiae. Allergic/Immunologic: no itchy/runny eyes, nasal congestion, recent allergic reactions, rashes  PHYSICAL EXAM: Filed Vitals:   11/18/15 1333  BP: 138/78  Pulse: 62   General: No acute distress Head:  Normocephalic/atraumatic Eyes: Fundoscopic exam shows bilateral sharp discs, no vessel changes, exudates, or hemorrhages Neck: supple, no paraspinal tenderness, full range of motion Back: No paraspinal tenderness Heart: regular rate and rhythm Lungs: Clear to auscultation bilaterally. Vascular: No carotid bruits. Skin/Extremities: No rash, no edema Neurological Exam: Mental status: alert and oriented to person, place, and time, no dysarthria or aphasia, Fund of knowledge is appropriate.  Recent and remote memory are intact. 3/3 delayed recall.  Attention and concentration are normal.    Able to name objects and repeat phrases. Cranial nerves: CN I: not tested CN II: pupils equal, round and reactive to light, visual fields intact, fundi unremarkable. CN III, IV, VI:  full range of motion, no nystagmus, no ptosis CN V: facial sensation intact CN VII: upper and lower face symmetric CN VIII: hearing intact to finger rub CN IX, X: gag intact, uvula midline CN XI: sternocleidomastoid and trapezius muscles intact CN XII: tongue midline Bulk & Tone: normal, no fasciculations. Motor: 5/5 throughout with no pronator drift. Sensation: intact to light touch, cold, pin, vibration and joint position sense.  No extinction to double simultaneous stimulation.  Romberg test negative Deep Tendon Reflexes: +2 throughout, no ankle clonus Plantar responses: downgoing bilaterally Cerebellar: no incoordination on  finger to nose, heel to shin. No dysdiadochokinesia Gait: narrow-based and steady, able to tandem walk adequately. Tremor: none  IMPRESSION: This is a pleasant 35 year old right-handed man with no significant past medical history, presenting with new onset seizure on 10/07/2015. No clear epilepsy risk factors, neurological exam normal. His head CT was overall unremarkable except for a small right posterior fossa arachnoid cyst. His UDS was positive for benzodiazepines, and he endorsed taking Xanax 0.5mg  the night prior, but states he takes this 2-3 times a week and not on a daily basis to clearly provoke a withdrawal seizure. We discussed that after an initial seizure, unless there are significant risk factors, an abnormal neurological exam, an EEG showing epileptiform abnormalities, and/or abnormal neuroimaging, treatment with an antiepileptic drug is not indicated. A 1-hour sleep-deprived EEG will be ordered to assess for focal abnormalities that increase risk for recurrent seizures. We discussed 10% of the population [place] have a single seizure. Patients with a single unprovoked seizure have a recurrence rate of 33%  after a single seizure and 73% after a second seizure. He was advised to avoid substance abuse. We discussed [place] driving restrictions which indicate a patient needs to free of seizures or events of altered awareness for 6 months prior to resuming driving. The patient agreed to comply with these restrictions.  Seizure precautions were discussed which include no driving, no bathing in a tub, no swimming alone, no cooking over an open flame, no operating dangerous machinery, and no activities which [place] endanger  oneself or someone else. He will follow-up in 2 months and knows to call our office for any changes.   Thank you for allowing me to participate in the care of this patient. Please do not hesitate to call for any questions or concerns.   Patrcia Dolly, M.D.  CC: Dr. Freida Busman

## 2015-11-18 NOTE — Patient Instructions (Signed)
1. Schedule 1-hour sleep-deprived EEG 2. Avoid alcohol, Xanax, or any other substances that can change the environment around the brain 3. As per [place] driving laws, no driving after a seizure until 6 months seizure-free 4. Follow-up in 2 months, call for any changes  Seizure Precautions:. 1. If medication has been prescribed for you to prevent seizures, take it exactly as directed.  Do not stop taking the medicine without talking to your doctor first, even if you have not had a seizure in a long time.   2. Avoid activities in which a seizure would cause danger to yourself or to others.  Don't operate dangerous machinery, swim alone, or climb in high or dangerous places, such as on ladders, roofs, or girders.  Do not drive unless your doctor says you Guerr[place].  3. If you have any warning that you Sovine have a seizure, lay down in a safe place where you can't hurt yourself.    4.  No driving for 6 months from last seizure, as per [facility].   Please refer to the following link on the Epilepsy Foundation of America's website for more information: http://www.epilepsyfoundation.org/answerplace/Social/driving/drivingu.cfm   5.  Maintain good sleep hygiene. Avoid alcohol.  6.  Contact your doctor if you have any problems that Marcano be related to the medicine you are taking.  7.  Call 911 and bring the patient back to the ED if:        A.  The seizure lasts longer than 5 minutes.       B.  The patient doesn't awaken shortly after the seizure  C.  The patient has new problems such as difficulty seeing, speaking or moving  D.  The patient was injured during the seizure  E.  The patient has a temperature over 102 F (39C)  F.  The patient vomited and now is having trouble breathing

## 2015-11-24 ENCOUNTER — Other Ambulatory Visit: Payer: Self-pay | Admitting: Neurology

## 2015-11-27 ENCOUNTER — Ambulatory Visit (INDEPENDENT_AMBULATORY_CARE_PROVIDER_SITE_OTHER): Payer: Self-pay | Admitting: Neurology

## 2015-11-27 DIAGNOSIS — R569 Unspecified convulsions: Secondary | ICD-10-CM

## 2015-12-05 NOTE — Procedures (Signed)
ELECTROENCEPHALOGRAM REPORT  Date of Study: 11/27/2015  Patient's Name: Miguel Kline MRN: [id number] Date of Birth: June 21, 1981  Referring Provider: Dr. Patrcia Dolly  Clinical History: This is a 35 year old man with new onset seizure.  Medications: none  Technical Summary: A multichannel digital 1-hour sleep-deprived EEG recording measured by the international 10-20 system with electrodes applied with paste and impedances below 5000 ohms performed in our laboratory with EKG monitoring in an awake and asleep patient.  Hyperventilation and photic stimulation were performed.  The digital EEG was referentially recorded, reformatted, and digitally filtered in a variety of bipolar and referential montages for optimal display.    Description: The patient is awake and asleep during the recording.  During maximal wakefulness, there is a symmetric, medium voltage 10 Hz posterior dominant rhythm that attenuates with eye opening.  The record is symmetric.  During drowsiness and sleep, there is an increase in theta slowing of the background, with shifting asymmetry seen over the bilateral temporal regions, left greater than right, at times sharply contoured without clear epileptogenic potential.  Vertex waves and symmetric sleep spindles were seen.  Hyperventilation and photic stimulation did not elicit any abnormalities.  There were no epileptiform discharges or electrographic seizures seen.    EKG lead was unremarkable.  Impression: This 1-hour awake and asleep EEG is normal.    Clinical Correlation: A normal EEG does not exclude a clinical diagnosis of epilepsy.  If further clinical questions remain, prolonged EEG Neale be helpful.  Clinical correlation is advised.   Patrcia Dolly, M.D.

## 2015-12-08 ENCOUNTER — Telehealth: Payer: Self-pay | Admitting: Family Medicine

## 2015-12-08 NOTE — Telephone Encounter (Signed)
-----   Message from Van Clines, MD sent at 12/05/2015 12:48 PM EST ----- Pls let him know EEG is normal. Call for any change in symptoms, f/u as scheduled. Thanks

## 2015-12-08 NOTE — Telephone Encounter (Signed)
Tried calling patient a few times, number is busy. Will try again.

## 2015-12-10 NOTE — Telephone Encounter (Signed)
Tried calling patient again & number is not working. Will send patient a result letter.

## 2016-01-22 ENCOUNTER — Ambulatory Visit: Payer: Self-pay | Admitting: Neurology

## 2016-01-22 DIAGNOSIS — Z029 Encounter for administrative examinations, unspecified: Secondary | ICD-10-CM

## 2018-06-14 ENCOUNTER — Emergency Department (HOSPITAL_COMMUNITY): Payer: Self-pay

## 2018-06-14 ENCOUNTER — Emergency Department (HOSPITAL_COMMUNITY)
Admission: EM | Admit: 2018-06-14 | Discharge: 2018-06-14 | Disposition: A | Discharge status: Home / Self Care | Payer: Self-pay | Attending: Emergency Medicine | Admitting: Emergency Medicine

## 2018-06-14 ENCOUNTER — Encounter (HOSPITAL_COMMUNITY): Payer: Self-pay

## 2018-06-14 DIAGNOSIS — F17210 Nicotine dependence, cigarettes, uncomplicated: Secondary | ICD-10-CM | POA: Insufficient documentation

## 2018-06-14 DIAGNOSIS — Z23 Encounter for immunization: Secondary | ICD-10-CM | POA: Insufficient documentation

## 2018-06-14 DIAGNOSIS — L03116 Cellulitis of left lower limb: Secondary | ICD-10-CM | POA: Insufficient documentation

## 2018-06-14 LAB — COMPREHENSIVE METABOLIC PANEL
ALBUMIN: 4.2 g/dL (ref 3.5–5.0)
ALK PHOS: 58 U/L (ref 38–126)
ALT: 9 U/L (ref 0–44)
ANION GAP: 11 (ref 5–15)
AST: 17 U/L (ref 15–41)
BUN: 12 mg/dL (ref 6–20)
CALCIUM: 9.5 mg/dL (ref 8.9–10.3)
CO2: 26 mmol/L (ref 22–32)
Chloride: 104 mmol/L (ref 98–111)
Creatinine, Ser: 0.91 mg/dL (ref 0.61–1.24)
GFR calc Af Amer: >60 mL/min (ref >60)
GFR calc non Af Amer: >60 mL/min (ref >60)
GLUCOSE: 147 mg/dL — AB (ref 70–99)
Potassium: 3.4 mmol/L — ABNORMAL LOW (ref 3.5–5.1)
Sodium: 141 mmol/L (ref 135–145)
Total Bilirubin: 0.4 mg/dL (ref 0.3–1.2)
Total Protein: 7.7 g/dL (ref 6.5–8.1)

## 2018-06-14 LAB — CBC WITH DIFFERENTIAL/PLATELET
ABS IMMATURE GRANULOCYTES: 0 10*3/uL (ref 0.0–0.1)
Basophils Absolute: 0.1 10*3/uL (ref 0.0–0.1)
Basophils Relative: 1 %
EOS ABS: 0 10*3/uL (ref 0.0–0.7)
Eosinophils Relative: 0 %
HEMATOCRIT: 41 % (ref 39.0–52.0)
Hemoglobin: 14.2 g/dL (ref 13.0–17.0)
IMMATURE GRANULOCYTES: 0 %
LYMPHS ABS: 1.5 10*3/uL (ref 0.7–4.0)
Lymphocytes Relative: 13 %
MCH: 31.4 pg (ref 26.0–34.0)
MCHC: 34.6 g/dL (ref 30.0–36.0)
MCV: 90.7 fL (ref 78.0–100.0)
MONOS PCT: 7 %
Monocytes Absolute: 0.8 10*3/uL (ref 0.1–1.0)
NEUTROS PCT: 79 %
Neutro Abs: 9.2 10*3/uL — ABNORMAL HIGH (ref 1.7–7.7)
Platelets: 318 10*3/uL (ref 150–400)
RBC: 4.52 MIL/uL (ref 4.22–5.81)
RDW: 11.6 % (ref 11.5–15.5)
WBC: 11.6 10*3/uL — ABNORMAL HIGH (ref 4.0–10.5)

## 2018-06-14 LAB — I-STAT CG4 LACTIC ACID, ED
LACTIC ACID, VENOUS: 1.02 mmol/L (ref 0.5–1.9)
Lactic Acid, Venous: 1.84 mmol/L (ref 0.5–1.9)

## 2018-06-14 LAB — PROTIME-INR
INR: 1
Prothrombin Time: 13.1 seconds (ref 11.4–15.2)

## 2018-06-14 MED ORDER — CEFTRIAXONE SODIUM 1 G IJ SOLR
1.0000 g | Freq: Once | INTRAMUSCULAR | Status: AC
  Administered 2018-06-14: 1 g via INTRAMUSCULAR
  Filled 2018-06-14: qty 10

## 2018-06-14 MED ORDER — DOXYCYCLINE HYCLATE 100 MG PO CAPS: 100.0000 mg | ORAL_CAPSULE | Freq: Two times a day (BID) | ORAL | 0 refills | Status: AC

## 2018-06-14 MED ORDER — KETOROLAC TROMETHAMINE 15 MG/ML IJ SOLN
7.5000 mg | Freq: Once | INTRAMUSCULAR | Status: AC
  Administered 2018-06-14: 7.5 mg via INTRAVENOUS
  Filled 2018-06-14: qty 1

## 2018-06-14 MED ORDER — TETANUS-DIPHTH-ACELL PERTUSSIS 5-2.5-18.5 LF-MCG/0.5 IM SUSP
0.5000 mL | Freq: Once | INTRAMUSCULAR | Status: AC
  Administered 2018-06-14: 0.5 mL via INTRAMUSCULAR
  Filled 2018-06-14: qty 0.5

## 2018-06-14 NOTE — Progress Notes (Signed)
Orthopedic Tech Progress Note Patient Details:  Cristal DeerChristopher Wecker 1980-12-18 [id number]  Ortho Devices Type of Ortho Device: Crutches, Postop shoe/boot Ortho Device/Splint Location: lle Ortho Device/Splint Interventions: Ordered, Application, Adjustment   Post Interventions Patient Tolerated: Well Instructions Provided: Care of device, Adjustment of device   Trinna PostMartinez, Milicent Acheampong J 06/14/2018, 10:50 PM
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
## 2018-06-14 NOTE — ED Notes (Signed)
ED Provider at bedside. 
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
## 2018-06-14 NOTE — Discharge Instructions (Addendum)
You [name] continue your warm salt baths. Please allow wound to stay clean and dry while at home. You [name] continue taking ibuprofen or tylenol for your pain. I have prescribed antibiotics please tablet 1 tablet twice a day for 7 days. If your symptoms worsen please return to the ED for reevaluation.
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
## 2018-06-19 LAB — CULTURE, BLOOD (ROUTINE X 2)
CULTURE: NO GROWTH
Culture: NO GROWTH

## 2018-06-20 ENCOUNTER — Emergency Department: Payer: No Typology Code available for payment source

## 2018-06-20 ENCOUNTER — Other Ambulatory Visit: Payer: Self-pay

## 2018-06-20 ENCOUNTER — Emergency Department
Admission: EM | Admit: 2018-06-20 | Discharge: 2018-06-20 | Disposition: A | Discharge status: Home / Self Care | Payer: No Typology Code available for payment source | Attending: Emergency Medicine | Admitting: Emergency Medicine

## 2018-06-20 DIAGNOSIS — F17290 Nicotine dependence, other tobacco product, uncomplicated: Secondary | ICD-10-CM | POA: Diagnosis not present

## 2018-06-20 DIAGNOSIS — M25572 Pain in left ankle and joints of left foot: Secondary | ICD-10-CM

## 2018-06-20 DIAGNOSIS — T1490XD Injury, unspecified, subsequent encounter: Secondary | ICD-10-CM

## 2018-06-20 DIAGNOSIS — S90512D Abrasion, left ankle, subsequent encounter: Secondary | ICD-10-CM | POA: Insufficient documentation

## 2018-06-20 LAB — COMPREHENSIVE METABOLIC PANEL
ALBUMIN: 4.2 g/dL (ref 3.5–5.0)
ALK PHOS: 75 U/L (ref 38–126)
ALT: 10 U/L (ref 0–44)
AST: 17 U/L (ref 15–41)
Anion gap: 8 (ref 5–15)
BUN: 12 mg/dL (ref 6–20)
CHLORIDE: 105 mmol/L (ref 98–111)
CO2: 25 mmol/L (ref 22–32)
Calcium: 8.8 mg/dL — ABNORMAL LOW (ref 8.9–10.3)
Creatinine, Ser: 0.69 mg/dL (ref 0.61–1.24)
GFR calc Af Amer: >60 mL/min (ref >60)
GFR calc non Af Amer: >60 mL/min (ref >60)
Glucose, Bld: 111 mg/dL — ABNORMAL HIGH (ref 70–99)
POTASSIUM: 3.5 mmol/L (ref 3.5–5.1)
SODIUM: 138 mmol/L (ref 135–145)
Total Bilirubin: 0.4 mg/dL (ref 0.3–1.2)
Total Protein: 7.5 g/dL (ref 6.5–8.1)

## 2018-06-20 LAB — CBC WITH DIFFERENTIAL/PLATELET
BASOS ABS: 0.1 10*3/uL (ref 0–0.1)
Basophils Relative: 1 %
EOS PCT: 1 %
Eosinophils Absolute: 0.1 10*3/uL (ref 0–0.7)
HCT: 44.4 % (ref 40.0–52.0)
Hemoglobin: 15.9 g/dL (ref 13.0–18.0)
LYMPHS ABS: 1.3 10*3/uL (ref 1.0–3.6)
Lymphocytes Relative: 15 %
MCH: 32.8 pg (ref 26.0–34.0)
MCHC: 35.9 g/dL (ref 32.0–36.0)
MCV: 91.5 fL (ref 80.0–100.0)
MONO ABS: 0.7 10*3/uL (ref 0.2–1.0)
Monocytes Relative: 8 %
NEUTROS ABS: 6.5 10*3/uL (ref 1.4–6.5)
Neutrophils Relative %: 75 %
Platelets: 416 10*3/uL (ref 150–440)
RBC: 4.86 MIL/uL (ref 4.40–5.90)
RDW: 12.8 % (ref 11.5–14.5)
WBC: 8.7 10*3/uL (ref 3.8–10.6)

## 2018-06-20 LAB — LACTIC ACID, PLASMA: LACTIC ACID, VENOUS: 1.7 mmol/L (ref 0.5–1.9)

## 2018-06-20 MED ORDER — LORAZEPAM 1 MG PO TABS
1.0000 mg | ORAL_TABLET | Freq: Once | ORAL | Status: AC
  Administered 2018-06-20: 1 mg via ORAL
  Filled 2018-06-20: qty 1

## 2018-06-20 MED ORDER — SODIUM CHLORIDE 0.9 % IV BOLUS
1000.0000 mL | Freq: Once | INTRAVENOUS | Status: AC
  Administered 2018-06-20: 1000 mL via INTRAVENOUS

## 2018-06-20 MED ORDER — TRAMADOL HCL 50 MG PO TABS
50.0000 mg | ORAL_TABLET | Freq: Once | ORAL | Status: AC
  Administered 2018-06-20: 50 mg via ORAL
  Filled 2018-06-20: qty 1
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
## 2018-07-10 ENCOUNTER — Ambulatory Visit: Payer: Self-pay | Admitting: Physician Assistant
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
## 2022-07-04 ENCOUNTER — Emergency Department (HOSPITAL_COMMUNITY)
Admission: EM | Admit: 2022-07-04 | Discharge: 2022-07-04 | Disposition: A | Discharge status: Home / Self Care | Payer: Self-pay | Attending: Emergency Medicine | Admitting: Emergency Medicine

## 2022-07-04 ENCOUNTER — Other Ambulatory Visit: Payer: Self-pay

## 2022-07-04 ENCOUNTER — Encounter (HOSPITAL_COMMUNITY): Payer: Self-pay | Admitting: Emergency Medicine

## 2022-07-04 ENCOUNTER — Emergency Department (HOSPITAL_COMMUNITY): Payer: Self-pay

## 2022-07-04 DIAGNOSIS — L03116 Cellulitis of left lower limb: Secondary | ICD-10-CM | POA: Insufficient documentation

## 2022-07-04 LAB — CBC WITH DIFFERENTIAL/PLATELET
Abs Immature Granulocytes: 0.04 10*3/uL (ref 0.00–0.07)
Basophils Absolute: 0.1 10*3/uL (ref 0.0–0.1)
Basophils Relative: 1 %
Eosinophils Absolute: 0.1 10*3/uL (ref 0.0–0.5)
Eosinophils Relative: 1 %
HCT: 40.5 % (ref 39.0–52.0)
Hemoglobin: 14.6 g/dL (ref 13.0–17.0)
Immature Granulocytes: 0 %
Lymphocytes Relative: 13 %
Lymphs Abs: 1.3 10*3/uL (ref 0.7–4.0)
MCH: 31.9 pg (ref 26.0–34.0)
MCHC: 36 g/dL (ref 30.0–36.0)
MCV: 88.4 fL (ref 80.0–100.0)
Monocytes Absolute: 0.7 10*3/uL (ref 0.1–1.0)
Monocytes Relative: 7 %
Neutro Abs: 7.7 10*3/uL (ref 1.7–7.7)
Neutrophils Relative %: 78 %
Platelets: 358 10*3/uL (ref 150–400)
RBC: 4.58 MIL/uL (ref 4.22–5.81)
RDW: 11.6 % (ref 11.5–15.5)
WBC: 10 10*3/uL (ref 4.0–10.5)
nRBC: 0 % (ref 0.0–0.2)

## 2022-07-04 LAB — COMPREHENSIVE METABOLIC PANEL
ALT: 15 U/L (ref 0–44)
AST: 25 U/L (ref 15–41)
Albumin: 4.4 g/dL (ref 3.5–5.0)
Alkaline Phosphatase: 82 U/L (ref 38–126)
Anion gap: 11 (ref 5–15)
BUN: 9 mg/dL (ref 6–20)
CO2: 26 mmol/L (ref 22–32)
Calcium: 9.5 mg/dL (ref 8.9–10.3)
Chloride: 100 mmol/L (ref 98–111)
Creatinine, Ser: 0.83 mg/dL (ref 0.61–1.24)
GFR, Estimated: >60 mL/min (ref >60)
Glucose, Bld: 126 mg/dL — ABNORMAL HIGH (ref 70–99)
Potassium: 4.2 mmol/L (ref 3.5–5.1)
Sodium: 137 mmol/L (ref 135–145)
Total Bilirubin: 0.9 mg/dL (ref 0.3–1.2)
Total Protein: 8 g/dL (ref 6.5–8.1)

## 2022-07-04 LAB — LACTIC ACID, PLASMA: Lactic Acid, Venous: 1.4 mmol/L (ref 0.5–1.9)

## 2022-07-04 MED ORDER — DOXYCYCLINE HYCLATE 100 MG PO CAPS: 100.0000 mg | ORAL_CAPSULE | Freq: Two times a day (BID) | ORAL | 0 refills | Status: DC
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
## 2022-07-04 NOTE — Discharge Instructions (Signed)
Please take the entire course of antibiotics that prescribed recommend that you take them on a full stomach, and with a large drink of water to prevent any irritation of your esophagus when you are taking the pills. They can make you sensitive to the sun so try to stay inside or wear plenty of sunscreen while taking. I recommend following up if infection is not improving despite treatment.
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
## 2024-04-21 ENCOUNTER — Other Ambulatory Visit: Payer: Self-pay

## 2024-04-21 ENCOUNTER — Emergency Department (EMERGENCY_DEPARTMENT_HOSPITAL): Payer: Self-pay

## 2024-04-21 ENCOUNTER — Encounter (HOSPITAL_COMMUNITY): Payer: Self-pay

## 2024-04-21 ENCOUNTER — Emergency Department (HOSPITAL_COMMUNITY)
Admission: EM | Admit: 2024-04-21 | Discharge: 2024-04-21 | Disposition: A | Discharge status: Home / Self Care | Payer: Self-pay | Attending: Emergency Medicine | Admitting: Emergency Medicine

## 2024-04-21 DIAGNOSIS — I82442 Acute embolism and thrombosis of left tibial vein: Secondary | ICD-10-CM | POA: Diagnosis not present

## 2024-04-21 DIAGNOSIS — Z720 Tobacco use: Secondary | ICD-10-CM | POA: Diagnosis not present

## 2024-04-21 DIAGNOSIS — M7989 Other specified soft tissue disorders: Secondary | ICD-10-CM

## 2024-04-21 DIAGNOSIS — M79605 Pain in left leg: Secondary | ICD-10-CM | POA: Diagnosis present

## 2024-04-21 LAB — I-STAT CHEM 8, ED
BUN: 7 mg/dL (ref 6–20)
Calcium, Ion: 1.15 mmol/L (ref 1.15–1.40)
Chloride: 103 mmol/L (ref 98–111)
Creatinine, Ser: 0.7 mg/dL (ref 0.61–1.24)
Glucose, Bld: 91 mg/dL (ref 70–99)
HCT: 39 % (ref 39.0–52.0)
Hemoglobin: 13.3 g/dL (ref 13.0–17.0)
Potassium: 4.1 mmol/L (ref 3.5–5.1)
Sodium: 142 mmol/L (ref 135–145)
TCO2: 26 mmol/L (ref 22–32)

## 2024-04-21 MED ORDER — APIXABAN (ELIQUIS) VTE STARTER PACK (10MG AND 5MG): ORAL_TABLET | ORAL | 0 refills | Status: AC

## 2024-04-21 MED ORDER — APIXABAN 5 MG PO TABS: 5.0000 mg | ORAL_TABLET | Freq: Two times a day (BID) | ORAL | Status: DC

## 2024-04-21 MED ORDER — APIXABAN 5 MG PO TABS
10.0000 mg | ORAL_TABLET | Freq: Two times a day (BID) | ORAL | Status: DC
  Administered 2024-04-21: 10 mg via ORAL
  Filled 2024-04-21: qty 2

## 2024-04-21 MED ORDER — APIXABAN (ELIQUIS) EDUCATION KIT FOR DVT/PE PATIENTS
PACK | Freq: Once | Status: AC
  Filled 2024-04-21: qty 1

## 2024-04-21 NOTE — ED Notes (Signed)
 Patient transported to Ultrasound

## 2024-04-21 NOTE — ED Triage Notes (Signed)
 Pt c.o left lower leg pain x 1 week. Pt also notes a yellow discoloration to the area and a knot. Denies hx of blood clots or injuries

## 2024-04-21 NOTE — Discharge Instructions (Addendum)
 You have been diagnosed with acute deep venous thrombosis involving the left leg.  You will need to be on blood thinner medication for further care.  Please follow-up closely with primary care doctor as you will need additional evaluation to identify the cause of your DVT.  Return to the ER if you have any concern.  I have provided for you a starter

## 2024-04-21 NOTE — Progress Notes (Signed)
 VASCULAR LAB    Left lower extremity venous duplex has been performed.  See CV proc for preliminary results.  Relayed results to Lonni Camp, PA-C via secure chat  RACHEL PELLET, RVT 04/21/2024, 10:37 AM

## 2024-04-21 NOTE — ED Provider Notes (Signed)
 [place] EMERGENCY DEPARTMENT AT [facility] Provider Note   CSN: [id number] Arrival date & time: 04/21/24  9144     Patient presents with: Leg Pain   Miguel Kline is a 43 y.o. male.   The history is provided by the patient and medical records. No language interpreter was used.  Leg Pain Associated symptoms: no fever    43 year old male presenting with complaint of leg pain.  Patient reports a week ago he noticed tenderness to his left leg which has been present since and now he noticed some discoloration to his skin at the site of the tenderness.  He also noted some swelling to his leg last night that has since improved.  He does not endorse any chest pain or shortness of breath no productive cough no hemoptysis no fever or chills.  He denies any injury but states that he first noticed the pain when he went on vacation last week and having to do a lot of walking on uneven surface.  He denies any prior history of PE or DVT and denies any recent surgery or prolonged bedrest.  No specific treatment tried.  She denies noticing any redness.  Described pain as an achy pressure sensation.    Prior to Admission medications   Medication Sig Start Date End Date Taking? Authorizing Provider  doxycycline  (VIBRAMYCIN ) 100 MG capsule Take 1 capsule (100 mg total) by mouth 2 (two) times daily. 07/04/22   Prosperi, Christian H, PA-C    Allergies: Patient has no known allergies.    Review of Systems  Constitutional:  Negative for fever.  Skin:  Negative for wound.    Updated Vital Signs BP (!) 156/105 (BP Location: Right Arm)   Pulse (!) 115   Temp 98.4 F (36.9 C)   Resp 15   SpO2 99%   Physical Exam Constitutional:      General: He is not in acute distress.    Appearance: He is well-developed.  HENT:     Head: Atraumatic.  Eyes:     Conjunctiva/sclera: Conjunctivae normal.  Cardiovascular:     Rate and Rhythm: Normal rate and regular rhythm.     Pulses: Normal  pulses.     Heart sounds: Normal heart sounds.  Pulmonary:     Effort: Pulmonary effort is normal.     Breath sounds: Normal breath sounds.  Musculoskeletal:        General: Tenderness (Left lower extremity: There is a linear faint bruising noted to the medial aspect of the tib-fib region with mild tenderness to palpation but no erythema edema or warmth noted.  No tenderness to the calf.  Intact dorsalis pedis pulse with brisk cap refill.) present.     Cervical back: Normal range of motion and neck supple.  Skin:    Findings: No rash.  Neurological:     Mental Status: He is alert.     (all labs ordered are listed, but only abnormal results are displayed) Labs Reviewed  I-STAT CHEM 8, ED    EKG: None  Radiology: VAS US  LOWER EXTREMITY VENOUS (DVT) (7a-7p) Result Date: 04/21/2024  Lower Venous DVT Study Patient Name:  Miguel Kline  Date of Exam:   04/21/2024 Medical Rec #: [id number]        Accession #:    [id number] Date of Birth: Sep 11, 1981        Patient Gender: M Patient Age:   58 years Exam Location:  [facility] Procedure:  VAS US  LOWER EXTREMITY VENOUS (DVT) Referring Phys: Krishon Adkison --------------------------------------------------------------------------------  Indications: Pain, Swelling, and Palpable knot and pain in mid calf.  Limitations: Movement secondary to patient being ticklish at common femoral area. Comparison Study: No prior study on file Performing Technologist: Alberta Lis RVS  Examination Guidelines: A complete evaluation includes B-mode imaging, spectral Doppler, color Doppler, and power Doppler as needed of all accessible portions of each vessel. Bilateral testing is considered an integral part of a complete examination. Limited examinations for reoccurring indications Sylvain be performed as noted. The reflux portion of the exam is performed with the patient in reverse Trendelenburg.   +---------+---------------+---------+-----------+----------+-------------------+ LEFT     CompressibilityPhasicitySpontaneityPropertiesThrombus Aging      +---------+---------------+---------+-----------+----------+-------------------+ CFV      Full           Yes      Yes                                      +---------+---------------+---------+-----------+----------+-------------------+ SFJ      Full                                                             +---------+---------------+---------+-----------+----------+-------------------+ FV Prox  Full                                                             +---------+---------------+---------+-----------+----------+-------------------+ FV Mid   Full                                                             +---------+---------------+---------+-----------+----------+-------------------+ FV DistalFull                                                             +---------+---------------+---------+-----------+----------+-------------------+ PFV      Full                                                             +---------+---------------+---------+-----------+----------+-------------------+ POP      Full           Yes      No                                       +---------+---------------+---------+-----------+----------+-------------------+ PTV      None  Acute               +---------+---------------+---------+-----------+----------+-------------------+ PERO     None                                         Acute               +---------+---------------+---------+-----------+----------+-------------------+ GSV      None                                         Acute at site of                                                          knot and pain       +---------+---------------+---------+-----------+----------+-------------------+    +-----+---------------+---------+-----------+----------+--------------+ RIGHTCompressibilityPhasicitySpontaneityPropertiesThrombus Aging +-----+---------------+---------+-----------+----------+--------------+ CFV  Full           Yes      Yes                                 +-----+---------------+---------+-----------+----------+--------------+ SFJ  Full                                                        +-----+---------------+---------+-----------+----------+--------------+     Summary: RIGHT: - No evidence of common femoral vein obstruction.  - Ultrasound characteristics of enlarged lymph nodes are noted in the groin.  LEFT: - Findings consistent with acute deep vein thrombosis involving the left popliteal vein, and left peroneal veins. - Findings consistent with acute superficial vein thrombosis involving the left great saphenous vein at site of knotting and pain mid calf.  - Ultrasound characteristics of enlarged lymph nodes noted in the groin.  *See table(s) above for measurements and observations.    Preliminary      Procedures   Medications Ordered in the ED  apixaban  (ELIQUIS ) tablet 10 mg (has no administration in time range)    Followed by  apixaban  (ELIQUIS ) tablet 5 mg (has no administration in time range)  apixaban  (ELIQUIS ) Education Kit for DVT/PE patients (has no administration in time range)                                    Medical Decision Making Risk Prescription drug management.   BP (!) 156/105 (BP Location: Right Arm)   Pulse (!) 115   Temp 98.4 F (36.9 C)   Resp 15   SpO2 99%   50:10 AM 43 year old male presenting with complaint of leg pain.  Patient reports a week ago he noticed tenderness to his left leg which has been present since and now he noticed some discoloration to his skin at the site of the tenderness.  He also noted some swelling to his leg last night that has since improved.  He does not endorse any  chest pain or shortness of  breath no productive cough no hemoptysis no fever or chills.  He denies any injury but states that he first noticed the pain when he went on vacation last week and having to do a lot of walking on uneven surface.  He denies any prior history of PE or DVT and denies any recent surgery or prolonged bedrest.  No specific treatment tried.  She denies noticing any redness.  Described pain as an achy pressure sensation.  On exam, patient has tenderness along the medial aspect of his left lower extremity adjacent to his calf without any actual calf tenderness.  He also has some faint discoloration of the skin that resembled an old bruise.  He is neurovascularly intact to ambulate with activity.  Given his concern, a vascular ultrasound of LLE have been ordered.  -Labs ordered, independently viewed and interpreted by me.  Labs remarkable for normal renal function -The patient was maintained on a cardiac monitor.  I personally viewed and interpreted the cardiac monitored which showed an underlying rhythm of: Sinus tachycardia -Imaging independently viewed and interpreted by me and I agree with radiologist's interpretation.  Result remarkable for vascular ultrasound demonstrate evidence of acute left DVT involving the left tibial and peroneal vein along with superficial thrombosis -This patient presents to the ED for concern of leg pain, this involves an extensive number of treatment options, and is a complaint that carries with it a high risk of complications and morbidity.  The differential diagnosis includes ecchymosis, DVT, cellulitis, peripheral vascular disease, superficial thrombophlebitis, strain, sprain, fracture, dislocation -Co morbidities that complicate the patient evaluation includes none -Treatment includes Eliquis  -Reevaluation of the patient after these medicines showed that the patient improved -PCP office notes or outside notes reviewed -Escalation to admission/observation considered: patients  feels much better, is comfortable with discharge, and will follow up with PCP -Prescription medication considered, patient comfortable with Eliquis  with a 30-day supply -Social Determinant of Health considered which includes tobacco use      Final diagnoses:  Acute deep vein thrombosis (DVT) of left tibial vein [facility])    ED Discharge Orders          Ordered    APIXABAN  (ELIQUIS ) VTE STARTER PACK (10MG  AND 5MG )       Note to Pharmacy: If starter pack unavailable, substitute with seventy-four 5 mg apixaban  tabs following the above SIG directions.   04/21/24 1152    AMB Referral to Deep Vein Thrombosis Clinic        04/21/24 1223               Nivia Colon, PA-C 04/21/24 1227    Dean Clarity, MD 04/22/24 [phone]

## 2024-04-25 ENCOUNTER — Other Ambulatory Visit (HOSPITAL_COMMUNITY): Payer: Self-pay

## 2024-04-25 ENCOUNTER — Ambulatory Visit: Payer: Self-pay | Attending: Vascular Surgery | Admitting: Student-PharmD

## 2024-04-25 ENCOUNTER — Encounter: Payer: Self-pay | Admitting: Student-PharmD

## 2024-04-25 VITALS — BP 124/81 | HR 85 | Wt 122.0 lb

## 2024-04-25 DIAGNOSIS — I82442 Acute embolism and thrombosis of left tibial vein: Secondary | ICD-10-CM

## 2024-04-25 NOTE — Progress Notes (Signed)
 DVT Clinic Note  Name: Miguel Kline     MRN: [id number]     DOB: [birth year]     Sex: male  PCP: Pcp, No  Today's Visit: Visit Information: Initial Visit  Referred to DVT Clinic by: Emergency Department - Dr. Dean Referred to CPP by: Dr. Sheree Reason for referral:  Chief Complaint  Patient presents with   Med Management - DVT   HISTORY OF PRESENT ILLNESS: Miguel Kline is a 43 y.o. male who presents after diagnosis of DVT for medication management. Patient presented to the ED 04/21/24 with left leg pain and swelling ongoing for about a week. Ultrasound showed acute DVT in the left peroneal and posterior tibial veins and superficial vein thrombosis in the left GSV at the area of the knot in his calf. He was started on Eliquis  and referred to DVT Clinic for follow up. Today, patient reports that he feels like Eliquis  is working well because his pain and swelling have significantly improved over the last week. Denies abnormal bleeding or bruising. Denies missed doses of Eliquis . Takes it at 6am and 6pm. Is not wearing compression stockings but has some at home he can use if his swelling returns. Denies personal or family history of DVT. 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 When he's home in the evening he sits still watching TV but only for about an hour. Even on the weekend when he is more sedentary he still gets up and moves regularly. Recently went on a camping trip but this was only 1.5 hours away. Denies chest pain, SOB. Father passed away from lung cancer in 2021 which motivated the patient to quit smoking later that year, mother has a possible history of lupus.   Positive Thrombotic Risk Factors: None Present Bleeding Risk Factors: Anticoagulant therapy  Negative Thrombotic Risk Factors: Previous VTE, Recent surgery (within 3 months), Recent trauma (within 3 months), Recent admission to hospital with acute illness (within 3 months), Paralysis, paresis, or recent plaster cast  immobilization of lower extremity, Central venous catheterization, Bed rest >72 hours within 3 months, Sedentary journey lasting >8 hours within 4 weeks, Pregnancy, Within 6 weeks postpartum, Recent cesarean section (within 3 months), Estrogen therapy, Testosterone therapy, Erythropoiesis-stimulating agent, Recent COVID diagnosis (within 3 months), Active cancer, Non-malignant, chronic inflammatory condition, Known thrombophilic condition, Smoking, Obesity, Older age  Rx Insurance Coverage: Uninsured Rx Affordability: Eliquis  is $700/month. 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[employer]  Tobacco Use   Smoking status: Former    Types: Cigars   Smokeless tobacco: Former  Building services engineer status: Every Day  Substance and Sexual Activity   Alcohol use: Yes    Alcohol/week: 0.0 standard drinks of alcohol   Drug use: Not on file   Sexual activity: Not on file  Other Topics Concern   Not on file  Social History Narrative   Not on file   Social Drivers of Health  Financial Resource Strain: Not on file  Food Insecurity: Not on file  Transportation Needs: Not on file  Physical Activity: Not on file  Stress: Not on file  Social Connections: Not on file  Intimate Partner Violence: Not on file    Family History  Problem Relation Age of Onset   Cancer Paternal Grandfather    Hypertension Mother     Allergies as of 04/25/2024   (No Known Allergies)    Current Outpatient Medications on File Prior to Visit   Medication Sig Dispense Refill   APIXABAN  (ELIQUIS ) VTE STARTER PACK (10MG  AND 5MG ) Take as directed on package: start with two-5mg  tablets twice daily for 7 days. On day 8, switch to one-5mg  tablet twice daily. 74 each 0   buprenorphine-naloxone (SUBOXONE) 8-2 mg SUBL SL tablet Place under the tongue in the morning and at bedtime. Takes 1/8 of a strip morning and evening     No current facility-administered medications on file prior to visit.   REVIEW OF SYSTEMS:  Review of Systems  Respiratory:  Negative for shortness of breath.   Cardiovascular:  Negative for chest pain, palpitations and leg swelling.  Musculoskeletal:  Positive for myalgias.  Neurological:  Positive for tingling. Negative for dizziness.   PHYSICAL EXAMINATION:  Vitals:   04/25/24 1343  BP: 124/81  Pulse: 85  SpO2: 96%  Weight: 122 lb (55.3 kg)    Body mass index is 17.02 kg/m.  Physical Exam Vitals reviewed.  Cardiovascular:     Rate and Rhythm: Normal rate.  Pulmonary:     Effort: Pulmonary effort is normal.  Psychiatric:        Mood and Affect: Mood normal.        Behavior: Behavior normal.        Thought Content: Thought content normal.   Villalta Score for Post-Thrombotic Syndrome: Pain: Mild Cramps: Mild Heaviness: Absent Paresthesia: Mild Pruritus: Absent Pretibial Edema: Mild Skin Induration: Absent Hyperpigmentation: Absent Redness: Absent Venous Ectasia: Absent Pain on calf compression: Absent Villalta Preliminary Score: 4 Is venous ulcer present?: No If venous ulcer is present and score is <15, then 15 points total are assigned: Absent Villalta Total Score: 4  LABS:  CBC     Component Value Date/Time   WBC 10.0 07/04/2022 0544   RBC 4.58 07/04/2022 0544   HGB 13.3 04/21/2024 1056   HCT 39.0 04/21/2024 1056   PLT 358 07/04/2022 0544   MCV 88.4 07/04/2022 0544   MCH 31.9 07/04/2022 0544   MCHC 36.0 07/04/2022 0544   RDW 11.6 07/04/2022 0544   LYMPHSABS 1.3 07/04/2022 0544    MONOABS 0.7 07/04/2022 0544   EOSABS 0.1 07/04/2022 0544   BASOSABS 0.1 07/04/2022 0544    Hepatic Function      Component Value Date/Time   PROT 8.0 07/04/2022 0544   ALBUMIN 4.4 07/04/2022 0544   AST 25 07/04/2022 0544   ALT 15 07/04/2022 0544   ALKPHOS 82 07/04/2022 0544   BILITOT 0.9 07/04/2022 0544    Renal Function   Lab Results  Component Value Date   CREATININE 0.70 04/21/2024   CREATININE 0.83 07/04/2022   CREATININE 0.69 06/20/2018    CrCl cannot be calculated (Unknown ideal weight.).   VVS Vascular Lab Studies:  04/21/24 VAS US  LOWER EXTREMITY VENOUS LEFT (DVT)  Summary:  RIGHT:  - No evidence of common femoral vein obstruction.  - Ultrasound characteristics of enlarged lymph nodes are noted in the  groin.   LEFT:  - Findings consistent  with acute deep vein thrombosis involving the left  posterior tibial vein and left peroneal veins.  - Findings consistent with acute superficial vein thrombosis involving the  left great saphenous vein at site of knotting and pain mid calf.  - Ultrasound characteristics of enlarged lymph nodes noted in the groin.   ASSESSMENT: Location of DVT: Left distal vein, Left superficial vein Cause of DVT: unprovoked  Patient without prior history of DVT diagnosed with acute DVT in the left posterior tibial and peroneal veins and superficial vein thrombosis in the left GSV (calf). He was started on the Eliquis  starter pack in the ED, which he is tolerating well and has seen significant improvements in symptoms. Swelling and pain are very mild today. No provoking risk factors identified, so we will refer the patient to hematology for further work up and management of duration of anticoagulation. He is currently uninsured which presents concern for medication access. He Sass qualify for Medicaid. Provided information on the [facility] support office at [facility] to assist with application. In the meantime, patient will  qualify for patient assistance for Eliquis  through [employer]. This was completed and submitted today. Patient advocate or myself will follow up with the patient when this is approved, at which time the medication would be shipped to him from the company for free. Counseled patient extensively on Eliquis , and all questions have been answered.   PLAN: -Continue apixaban  (Eliquis ) 10 mg twice daily for 7 days followed by 5 mg twice daily. -Expected duration of therapy: per hematology. Therapy started on 04/21/24. -Patient educated on purpose, proper use and potential adverse effects of apixaban  (Eliquis ). -Discussed importance of taking medication around the same time every day. -Advised patient of medications to avoid (NSAIDs, aspirin doses >100 mg daily). -Educated that Tylenol  (acetaminophen ) is the preferred analgesic to lower the risk of bleeding. -Advised patient to alert all providers of anticoagulation therapy prior to starting a new medication or having a procedure. -Emphasized importance of monitoring for signs and symptoms of bleeding (abnormal bruising, prolonged bleeding, nose bleeds, bleeding from gums, discolored urine, black tarry stools). -Educated patient to present to the ED if emergent signs and symptoms of new thrombosis occur. -Counseled patient to wear compression stockings daily, removing at night. Counseled on proper leg elevation.   Follow up: Referred to hematology. DVT Clinic as needed.  Lum Herald, PharmD, [place], CPP Deep Vein Thrombosis Clinic Clinical Pharmacist Practitioner [phone]

## 2024-04-25 NOTE — Patient Instructions (Signed)
 [place]  has recently expanded access to Medicaid. [place] is offering assistance for patients seeking to apply for Medicaid to connect in-person with [facility] DSS Medicaid Eligibility and Enrollment Specialists. With no appointment necessary, individuals can simply walk in and receive assistance from an available caseworker. They will provide help in submitting applications for Medicaid.   Where: [facility] [phone]. East in [place]; fourth floor) When: Mondays through Fridays between 8am and 5pm (closed 12:30-1:30pm for lunch)  Visit [email or web address] to learn more about eligibility and how to apply.    -Continue apixaban  (Eliquis ) 10 mg twice daily for 7 days followed by 5 mg twice daily. -Stef or Danial Hlavac will be in touch when we hear back from Eli[employer]  on the application. If approved, this would be delivered to your house for free.  -We are referring you to see a hematologist. -It is important to take your medication around the same time every day.  -Avoid NSAIDs like ibuprofen (Advil, Motrin) and naproxen  (Aleve ) as well as aspirin doses over 100 mg daily. -Tylenol  (acetaminophen ) is the preferred over the counter pain medication to lower the risk of bleeding. -Be sure to alert all of your health care providers that you are taking an anticoagulant prior to starting a new medication or having a procedure. -Monitor for signs and symptoms of bleeding (abnormal bruising, prolonged bleeding, nose bleeds, bleeding from gums, discolored urine, black tarry stools). If you have fallen and hit your head OR if your bleeding is severe or not stopping, seek emergency care.  -Go to the emergency room if emergent signs and symptoms of new clot occur (new or worse swelling and pain in an arm or leg, shortness of breath, chest pain, fast or irregular heartbeats, lightheadedness, dizziness, fainting, coughing up blood) or if you experience a significant  color change (pale or blue) in the extremity that has the DVT.  -We recommend you wear compression stockings (20-30 mmHg) as long as you are having swelling or pain. Be sure to purchase the correct size and take them off at night.   If you have any questions or need to reschedule an appointment, please call [phone]. If you are having an emergency, call 911 or present to the nearest emergency room.   What is a DVT?  -Deep vein thrombosis (DVT) is a condition in which a blood clot forms in a vein of the deep venous system which can occur in the lower leg, thigh, pelvis, arm, or neck. This condition is serious and can be life-threatening if the clot travels to the arteries of the lungs and causing a blockage (pulmonary embolism, PE). A DVT can also damage veins in the leg, which can lead to long-term venous disease, leg pain, swelling, discoloration, and ulcers or sores (post-thrombotic syndrome).  -Treatment [place] include taking an anticoagulant medication to prevent more clots from forming and the current clot from growing, wearing compression stockings, and/or surgical procedures to remove or dissolve the clot.

## 2024-04-26 ENCOUNTER — Telehealth: Payer: Self-pay

## 2024-04-26 NOTE — Telephone Encounter (Signed)
 Patient Advocate Encounter  Application for Eliquis  faxed to [employer] on 04/25/2024. Application form attached to patient chart.  Rachel DEL, CPhT Rx Patient Advocate Phone: [phone]

## 2024-05-03 NOTE — Telephone Encounter (Signed)
 Patient was approved to receive Eliquis  from [employer] Effective 04/30/2024 to 04/29/2025  Informed patient by phone, pt has already spoken with [employer] to coordinate first shipment.

## 2024-05-21 ENCOUNTER — Telehealth: Payer: Self-pay | Admitting: Pharmacist

## 2024-05-21 NOTE — Telephone Encounter (Signed)
 Patient with diagnosis of DVT on Eliquis  for anticoagulation.    Procedure: 10 tooth extractions Date of procedure: pending clearance  CrCl 90 mL/min Platelet count 358  Patient does not require pre-op antibiotics for dental procedure.  Patient is 1 month out from distal DVT diagnosis. Discussed patient with Dr. Pearline and per his recommendation, patient can hold  Eliquis  for 1 day prior to procedure. Faxed recommendation back to dentist office and called patient to notify him.  [facility] Phone: [phone] Fax: [phone]

## 2024-06-13 ENCOUNTER — Other Ambulatory Visit: Payer: Self-pay | Admitting: Medical Oncology

## 2024-06-13 DIAGNOSIS — I82442 Acute embolism and thrombosis of left tibial vein: Secondary | ICD-10-CM

## 2024-06-14 ENCOUNTER — Inpatient Hospital Stay: Payer: Self-pay

## 2024-06-14 ENCOUNTER — Inpatient Hospital Stay: Payer: Self-pay | Attending: Internal Medicine | Admitting: Internal Medicine

## 2024-06-14 VITALS — BP 110/84 | HR 107 | Temp 98.1°F | Resp 17 | Wt 122.0 lb

## 2024-06-14 DIAGNOSIS — I82612 Acute embolism and thrombosis of superficial veins of left upper extremity: Secondary | ICD-10-CM | POA: Insufficient documentation

## 2024-06-14 DIAGNOSIS — Z87891 Personal history of nicotine dependence: Secondary | ICD-10-CM

## 2024-06-14 DIAGNOSIS — Z7901 Long term (current) use of anticoagulants: Secondary | ICD-10-CM | POA: Diagnosis not present

## 2024-06-14 DIAGNOSIS — Z8614 Personal history of Methicillin resistant Staphylococcus aureus infection: Secondary | ICD-10-CM | POA: Diagnosis not present

## 2024-06-14 DIAGNOSIS — Z5986 Financial insecurity: Secondary | ICD-10-CM | POA: Insufficient documentation

## 2024-06-14 DIAGNOSIS — F1120 Opioid dependence, uncomplicated: Secondary | ICD-10-CM | POA: Insufficient documentation

## 2024-06-14 DIAGNOSIS — I82452 Acute embolism and thrombosis of left peroneal vein: Secondary | ICD-10-CM | POA: Insufficient documentation

## 2024-06-14 DIAGNOSIS — I82442 Acute embolism and thrombosis of left tibial vein: Secondary | ICD-10-CM | POA: Insufficient documentation

## 2024-06-14 DIAGNOSIS — F17290 Nicotine dependence, other tobacco product, uncomplicated: Secondary | ICD-10-CM | POA: Diagnosis not present

## 2024-06-14 DIAGNOSIS — R599 Enlarged lymph nodes, unspecified: Secondary | ICD-10-CM

## 2024-06-14 DIAGNOSIS — F1121 Opioid dependence, in remission: Secondary | ICD-10-CM

## 2024-06-14 LAB — CMP (CANCER CENTER ONLY)
ALT: 15 U/L (ref 0–44)
AST: 20 U/L (ref 15–41)
Albumin: 4.7 g/dL (ref 3.5–5.0)
Alkaline Phosphatase: 53 U/L (ref 38–126)
Anion gap: 6 (ref 5–15)
BUN: 13 mg/dL (ref 6–20)
CO2: 31 mmol/L (ref 22–32)
Calcium: 9.7 mg/dL (ref 8.9–10.3)
Chloride: 103 mmol/L (ref 98–111)
Creatinine: 0.74 mg/dL (ref 0.61–1.24)
GFR, Estimated: >60 mL/min (ref >60)
Glucose, Bld: 99 mg/dL (ref 70–99)
Potassium: 4 mmol/L (ref 3.5–5.1)
Sodium: 140 mmol/L (ref 135–145)
Total Bilirubin: 0.4 mg/dL (ref 0.0–1.2)
Total Protein: 7.6 g/dL (ref 6.5–8.1)

## 2024-06-14 LAB — CBC WITH DIFFERENTIAL (CANCER CENTER ONLY)
Abs Immature Granulocytes: 0.02 K/uL (ref 0.00–0.07)
Basophils Absolute: 0.1 K/uL (ref 0.0–0.1)
Basophils Relative: 1 %
Eosinophils Absolute: 0 K/uL (ref 0.0–0.5)
Eosinophils Relative: 1 %
HCT: 38 % — ABNORMAL LOW (ref 39.0–52.0)
Hemoglobin: 13.7 g/dL (ref 13.0–17.0)
Immature Granulocytes: 0 %
Lymphocytes Relative: 15 %
Lymphs Abs: 1.2 K/uL (ref 0.7–4.0)
MCH: 31.4 pg (ref 26.0–34.0)
MCHC: 36.1 g/dL — ABNORMAL HIGH (ref 30.0–36.0)
MCV: 87 fL (ref 80.0–100.0)
Monocytes Absolute: 0.5 K/uL (ref 0.1–1.0)
Monocytes Relative: 7 %
Neutro Abs: 6 K/uL (ref 1.7–7.7)
Neutrophils Relative %: 76 %
Platelet Count: 247 K/uL (ref 150–400)
RBC: 4.37 MIL/uL (ref 4.22–5.81)
RDW: 12.1 % (ref 11.5–15.5)
WBC Count: 7.8 K/uL (ref 4.0–10.5)
nRBC: 0 % (ref 0.0–0.2)
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
## 2024-06-27 ENCOUNTER — Ambulatory Visit (HOSPITAL_COMMUNITY)
Admission: RE | Admit: 2024-06-27 | Discharge: 2024-06-27 | Disposition: A | Discharge status: Home / Self Care | Payer: Self-pay | Source: Ambulatory Visit | Attending: Internal Medicine | Admitting: Internal Medicine

## 2024-06-27 DIAGNOSIS — R599 Enlarged lymph nodes, unspecified: Secondary | ICD-10-CM | POA: Insufficient documentation

## 2024-06-27 MED ORDER — IOHEXOL 300 MG/ML  SOLN
100.0000 mL | Freq: Once | INTRAMUSCULAR | Status: AC | PRN
  Administered 2024-06-27: 100 mL via INTRAVENOUS

## 2024-06-27 MED ORDER — IOHEXOL 9 MG/ML PO SOLN
1000.0000 mL | Freq: Once | ORAL | Status: AC
  Administered 2024-06-27: 1000 mL via ORAL

## 2024-07-04 ENCOUNTER — Other Ambulatory Visit: Payer: Self-pay | Admitting: Medical Oncology

## 2024-07-04 DIAGNOSIS — I82442 Acute embolism and thrombosis of left tibial vein: Secondary | ICD-10-CM

## 2024-07-05 ENCOUNTER — Inpatient Hospital Stay: Payer: Self-pay | Attending: Internal Medicine | Admitting: Internal Medicine

## 2024-07-05 ENCOUNTER — Inpatient Hospital Stay: Payer: Self-pay

## 2024-07-05 VITALS — BP 124/68 | HR 81 | Temp 98.2°F | Resp 17 | Ht 71.0 in | Wt 138.7 lb

## 2024-07-05 DIAGNOSIS — I82409 Acute embolism and thrombosis of unspecified deep veins of unspecified lower extremity: Secondary | ICD-10-CM | POA: Diagnosis present

## 2024-07-05 DIAGNOSIS — Z7901 Long term (current) use of anticoagulants: Secondary | ICD-10-CM | POA: Insufficient documentation

## 2024-07-05 DIAGNOSIS — I82452 Acute embolism and thrombosis of left peroneal vein: Secondary | ICD-10-CM

## 2024-07-05 DIAGNOSIS — I82442 Acute embolism and thrombosis of left tibial vein: Secondary | ICD-10-CM

## 2024-07-05 DIAGNOSIS — Z8614 Personal history of Methicillin resistant Staphylococcus aureus infection: Secondary | ICD-10-CM | POA: Insufficient documentation

## 2024-07-05 LAB — CMP (CANCER CENTER ONLY)
ALT: 75 U/L — ABNORMAL HIGH (ref 0–44)
AST: 51 U/L — ABNORMAL HIGH (ref 15–41)
Albumin: 4.3 g/dL (ref 3.5–5.0)
Alkaline Phosphatase: 56 U/L (ref 38–126)
Anion gap: 6 (ref 5–15)
BUN: 13 mg/dL (ref 6–20)
CO2: 34 mmol/L — ABNORMAL HIGH (ref 22–32)
Calcium: 9.2 mg/dL (ref 8.9–10.3)
Chloride: 101 mmol/L (ref 98–111)
Creatinine: 0.71 mg/dL (ref 0.61–1.24)
GFR, Estimated: >60 mL/min (ref >60)
Glucose, Bld: 101 mg/dL — ABNORMAL HIGH (ref 70–99)
Potassium: 3.8 mmol/L (ref 3.5–5.1)
Sodium: 141 mmol/L (ref 135–145)
Total Bilirubin: 0.4 mg/dL (ref 0.0–1.2)
Total Protein: 7.1 g/dL (ref 6.5–8.1)

## 2024-07-05 LAB — CBC WITH DIFFERENTIAL (CANCER CENTER ONLY)
Abs Immature Granulocytes: 0.01 K/uL (ref 0.00–0.07)
Basophils Absolute: 0.1 K/uL (ref 0.0–0.1)
Basophils Relative: 1 %
Eosinophils Absolute: 0.2 K/uL (ref 0.0–0.5)
Eosinophils Relative: 3 %
HCT: 35.9 % — ABNORMAL LOW (ref 39.0–52.0)
Hemoglobin: 12.8 g/dL — ABNORMAL LOW (ref 13.0–17.0)
Immature Granulocytes: 0 %
Lymphocytes Relative: 23 %
Lymphs Abs: 1.4 K/uL (ref 0.7–4.0)
MCH: 31.7 pg (ref 26.0–34.0)
MCHC: 35.7 g/dL (ref 30.0–36.0)
MCV: 88.9 fL (ref 80.0–100.0)
Monocytes Absolute: 0.6 K/uL (ref 0.1–1.0)
Monocytes Relative: 10 %
Neutro Abs: 3.8 K/uL (ref 1.7–7.7)
Neutrophils Relative %: 63 %
Platelet Count: 268 K/uL (ref 150–400)
RBC: 4.04 MIL/uL — ABNORMAL LOW (ref 4.22–5.81)
RDW: 12 % (ref 11.5–15.5)
WBC Count: 6 K/uL (ref 4.0–10.5)
nRBC: 0 % (ref 0.0–0.2)

## 2024-07-05 NOTE — Progress Notes (Signed)
 [facility] Telephone:([phone]) [phone]   Fax:(336) [phone]  OFFICE PROGRESS NOTE  Pcp, No No address on file  DIAGNOSIS: Acute deep venous thrombosis diagnosed in July 2025.  PRIOR THERAPY: None  CURRENT THERAPY: Eliquis  5 mg p.o. twice daily  INTERVAL HISTORY: Miguel Kline 43 y.o. male returns to the clinic today for follow-up visit accompanied by his mother. Discussed the use of AI scribe software for clinical note transcription with the patient, who gave verbal consent to proceed.  History of Present Illness Miguel Kline is a 43 year old male with acute venous thrombosis who presents for evaluation after a CT scan of the abdomen and pelvis. He is accompanied by his mother.  He was diagnosed with acute venous thrombosis in July 2025 and is currently on treatment with Eliquis . A recent CT scan of the abdomen and pelvis was performed.  He reports no new symptoms since his last visit a few weeks ago. No chest pain, breathing issues, or swelling in his legs. He continues to take Eliquis  as part of his treatment regimen.     MEDICAL HISTORY: Past Medical History:  Diagnosis Date   MRSA (methicillin resistant staph aureus) culture positive     ALLERGIES:  has no known allergies.  MEDICATIONS:  Current Outpatient Medications  Medication Sig Dispense Refill   APIXABAN  (ELIQUIS ) VTE STARTER PACK (10MG  AND 5MG ) Take as directed on package: start with two-5mg  tablets twice daily for 7 days. On day 8, switch to one-5mg  tablet twice daily. 74 each 0   buprenorphine-naloxone (SUBOXONE) 8-2 mg SUBL SL tablet Place under the tongue in the morning and at bedtime. Takes 1/8 of a strip morning and evening     No current facility-administered medications for this visit.    SURGICAL HISTORY:  Past Surgical History:  Procedure Laterality Date   NO PAST SURGERIES      REVIEW OF SYSTEMS:  A comprehensive review of systems was negative.   PHYSICAL EXAMINATION:  General appearance: alert, cooperative, fatigued, and no distress Head: Normocephalic, without obvious abnormality, atraumatic Neck: no adenopathy, no JVD, supple, symmetrical, trachea midline, and thyroid not enlarged, symmetric, no tenderness/mass/nodules Lymph nodes: Cervical, supraclavicular, and axillary nodes normal. Resp: clear to auscultation bilaterally Back: symmetric, no curvature. ROM normal. No CVA tenderness. Cardio: regular rate and rhythm, S1, S2 normal, no murmur, click, rub or gallop GI: soft, non-tender; bowel sounds normal; no masses,  no organomegaly Extremities: extremities normal, atraumatic, no cyanosis or edema  ECOG PERFORMANCE STATUS: 1 - Symptomatic but completely ambulatory  Blood pressure 124/68, pulse 81, temperature 98.2 F (36.8 C), resp. rate 17, height 5' 11 (1.803 m), weight 138 lb 11.2 oz (62.9 kg), SpO2 96%.  LABORATORY DATA: Lab Results  Component Value Date   WBC 6.0 07/05/2024   HGB 12.8 (L) 07/05/2024   HCT 35.9 (L) 07/05/2024   MCV 88.9 07/05/2024   PLT 268 07/05/2024      Chemistry      Component Value Date/Time   NA 141 07/05/2024 1038   K 3.8 07/05/2024 1038   CL 101 07/05/2024 1038   CO2 34 (H) 07/05/2024 1038   BUN 13 07/05/2024 1038   CREATININE 0.71 07/05/2024 1038      Component Value Date/Time   CALCIUM 9.2 07/05/2024 1038   ALKPHOS 56 07/05/2024 1038   AST 51 (H) 07/05/2024 1038   ALT 75 (H) 07/05/2024 1038   BILITOT 0.4 07/05/2024 1038       RADIOGRAPHIC  STUDIES: CT ABDOMEN PELVIS W CONTRAST Result Date: 06/30/2024 CLINICAL DATA:  Lymphadenopathy, groin.  * Tracking Code: BO * EXAM: CT ABDOMEN AND PELVIS WITH CONTRAST TECHNIQUE: Multidetector CT imaging of the abdomen and pelvis was performed using the standard protocol following bolus administration of intravenous contrast. RADIATION DOSE REDUCTION: This exam was performed according to the departmental dose-optimization program which includes automated exposure  control, adjustment of the mA and/or kV according to patient size and/or use of iterative reconstruction technique. CONTRAST:  100mL OMNIPAQUE  IOHEXOL  300 MG/ML  SOLN COMPARISON:  None Available. FINDINGS: Lower chest: Motion degraded examination of the lung bases. Hepatobiliary: No suspicious hepatic lesion on motion degraded examination of the liver. Pancreas: No pancreatic ductal dilation. Spleen: No splenomegaly. Adrenals/Urinary Tract: No suspicious adrenal nodule/mass. No hydronephrosis. Kidneys demonstrate symmetric enhancement. Urinary bladder is unremarkable for degree of distension. Stomach/Bowel: Stomach is unremarkable for degree of distension. Radiopaque enteric contrast material traverses the mid transverse colon. No pathologic dilation of small or large bowel. Colonic stool burden compatible with constipation. Vascular/Lymphatic: Normal caliber abdominal aorta. Smooth IVC contours. The portal, splenic and superior mesenteric veins are patent. No pathologically enlarged abdominal or pelvic lymph nodes. Reproductive: Prostate is unremarkable. Other: No significant abdominopelvic free fluid. Musculoskeletal: No acute or significant osseous findings. IMPRESSION: 1. No pathologically enlarged abdominal or pelvic lymph nodes. 2. Colonic stool burden compatible with constipation. 3. Motion degraded examination of the lung bases and upper abdomen. Electronically Signed   By: Reyes Holder M.D.   On: 06/30/2024 15:41    ASSESSMENT AND PLAN:  Assessment and Plan Assessment & Plan Acute venous thrombosis Acute venous thrombosis diagnosed in July 2025, currently managed with Eliquis  5 mg BID. Recent CT scan of the abdomen and pelvis shows no lymphadenopathy or compression of blood vessels, indicating well-managed condition. Plan to continue anticoagulation for a total of six months. Discussion of potential hypercoagulable state to be evaluated after completion of anticoagulation therapy. - Continue  Eliquis  5 mg BID for six months. - Discontinue Eliquis  in February 2026, one month prior to follow-up. - Schedule follow-up appointment in March 2026. - Order blood work in March 2026 to evaluate for hypercoagulable state.  The patient voices understanding of current disease status and treatment options and is in agreement with the current care plan.  All questions were answered. The patient knows to call the clinic with any problems, questions or concerns. We can certainly see the patient much sooner if necessary. The total time spent in the appointment was 20 minutes including review of chart and various tests results, discussions about plan of care and coordination of care plan .   Disclaimer: This note was dictated with voice recognition software. Similar sounding words can inadvertently be transcribed and [place] not be corrected upon review.

## 2024-09-11 ENCOUNTER — Telehealth: Payer: Self-pay

## 2024-09-11 ENCOUNTER — Other Ambulatory Visit: Payer: Self-pay

## 2024-09-11 ENCOUNTER — Ambulatory Visit (HOSPITAL_COMMUNITY)
Admission: RE | Admit: 2024-09-11 | Discharge: 2024-09-11 | Disposition: A | Discharge status: Home / Self Care | Payer: Self-pay | Source: Ambulatory Visit | Attending: Internal Medicine | Admitting: Internal Medicine

## 2024-09-11 DIAGNOSIS — I82442 Acute embolism and thrombosis of left tibial vein: Secondary | ICD-10-CM | POA: Insufficient documentation

## 2024-09-11 NOTE — Telephone Encounter (Signed)
 Spoke with patient regarding results of today's venous Doppler. Patient informed that no evidence of DVT was found. Advised patient to use Tylenol  for pain management, elevate the affected leg, and alternate ice and heat to reduce discomfort. Patient instructed to follow up with PCP or visit urgent care if pain persists. Additionally, patient advised to contact the office if new swelling or redness develops. Patient verbalized understanding of instructions and expressed appreciation for the follow-up call.

## 2024-09-11 NOTE — Telephone Encounter (Signed)
 S/w patient regarding progressive soreness in left leg. Patient has history of blood clots in left leg and is currently on Eliquis . Patient noted soreness started Saturday. No acute injuries or changes in activity that could account for pain. Patient denies any edema, redness, or warmth at site.  Dr. Sherrod made aware and patient to have repeat doppler to rule out any blood clots.  Patient scheduled for stat doppler today at 2:30 PM. Patient aware that the office will call with results following the procedure.  Patient verbalized an understanding of the information and confirmed appointment.

## 2024-12-27 ENCOUNTER — Other Ambulatory Visit

## 2025-01-03 ENCOUNTER — Ambulatory Visit: Admitting: Internal Medicine
